# Patient Record
Sex: Male | Born: 1965 | Race: White | Hispanic: No | Marital: Married | Smoking: Never smoker
Health system: Southern US, Community
[De-identification: ages and names within clinical notes are randomized; demographics above are authoritative.]

## PROBLEM LIST (undated history)

## (undated) DIAGNOSIS — K219 Gastro-esophageal reflux disease without esophagitis: Secondary | ICD-10-CM

## (undated) DIAGNOSIS — I10 Essential (primary) hypertension: Secondary | ICD-10-CM

## (undated) HISTORY — PX: TONSILLECTOMY: SUR1361

---

## 1998-08-25 ENCOUNTER — Encounter: Admission: RE | Admit: 1998-08-25 | Discharge: 1998-08-25 | Payer: Self-pay | Admitting: Sports Medicine

## 2001-05-10 ENCOUNTER — Ambulatory Visit (HOSPITAL_COMMUNITY): Admission: RE | Admit: 2001-05-10 | Discharge: 2001-05-10 | Payer: Self-pay | Admitting: Gastroenterology

## 2006-06-30 ENCOUNTER — Encounter: Admission: RE | Admit: 2006-06-30 | Discharge: 2006-06-30 | Payer: Self-pay | Admitting: Gastroenterology

## 2008-10-09 ENCOUNTER — Emergency Department (HOSPITAL_BASED_OUTPATIENT_CLINIC_OR_DEPARTMENT_OTHER): Admission: EM | Admit: 2008-10-09 | Discharge: 2008-10-09 | Payer: Self-pay | Admitting: Emergency Medicine

## 2008-10-31 ENCOUNTER — Ambulatory Visit (HOSPITAL_BASED_OUTPATIENT_CLINIC_OR_DEPARTMENT_OTHER): Admission: RE | Admit: 2008-10-31 | Discharge: 2008-10-31 | Payer: Self-pay | Admitting: Family Medicine

## 2008-10-31 ENCOUNTER — Ambulatory Visit: Payer: Self-pay | Admitting: Diagnostic Radiology

## 2010-08-13 ENCOUNTER — Emergency Department (HOSPITAL_COMMUNITY): Admission: EM | Admit: 2010-08-13 | Discharge: 2010-08-13 | Payer: Self-pay | Admitting: Emergency Medicine

## 2010-08-18 ENCOUNTER — Ambulatory Visit (HOSPITAL_BASED_OUTPATIENT_CLINIC_OR_DEPARTMENT_OTHER): Admission: RE | Admit: 2010-08-18 | Discharge: 2010-08-18 | Payer: Self-pay | Admitting: Orthopedic Surgery

## 2011-02-04 LAB — POCT HEMOGLOBIN-HEMACUE: Hemoglobin: 15.7 g/dL (ref 13.0–17.0)

## 2011-04-09 NOTE — Procedures (Signed)
[facility]  Patient:    Timothy Contreras, Timothy Contreras                         MRN: [id number] Proc. Date: 05/10/01 Adm. Date:  [id number] Attending:  Charna Elizabeth CC:         Gabriel Earing, M.D.   Procedure Report  DATE OF BIRTH:  July 13, 1966  REFERRING PHYSICIAN:  Gabriel Earing, M.D.  PROCEDURE PERFORMED:  Esophagogastroduodenoscopy with biopsies.  ENDOSCOPIST:  Anselmo Rod, M.D.  INSTRUMENT USED:  Olympus video panendoscope.  INDICATIONS FOR PROCEDURE:  The patient is a 45 year old white male with a history of black stool and drop in hemoglobin to 9.8 gm/dl.  Rule out peptic ulcer disease, esophagitis, gastritis, etc.  PREPROCEDURE PREPARATION:  Informed consent was procured from the patient. The patient was fasted for eight hours prior to the procedure.  CBC was checked the night prior to the procedure and hemoglobin was 10.6 gm/dl.  PT and PTT were within normal limits.  PREPROCEDURE PHYSICAL:  The patient had stable vital signs.  Neck supple. Chest clear to auscultation.  S1, S2 regular.  Abdomen soft with normal abdominal bowel sounds.  DESCRIPTION OF PROCEDURE:  The patient was placed in left lateral decubitus position and sedated with 50 mg of Demerol and 5 mg of Versed intravenously. Once the patient was adequately sedated and maintained on low-flow oxygen and continuous cardiac monitoring, the Olympus video panendoscope was advanced through the mouthpiece, over the tongue, into the esophagus under direct vision.  There was grade 1 distal esophagitis seen.  The rest of the gastric mucosa appeared healthy and without lesion.  There was moderate to severe duodenitis of the duodenal bulb.  The small bowel distal to the bulb appeared normal.  There was no outlet obstruction.  Biopsies were done from the antrum to rule out presence of Helicobacter pylori by CLO test.  IMPRESSION: 1. Grade 1 distal esophagitis. 2. Severe duodenitis in duodenal  bulb. 3. Normal small bowel distal to the bulb.  RECOMMENDATION: 1. Continue PPIs for now. 2. Outpatient follow-up in the next four weeks.  A repeat CBC will be done at    that time. 3. Avoid all nonsteroidals including aspirin for now. 4. Call the office immediately for any recurrence of black stool or    bright red blood per rectum. DD:  05/10/01 TD:  05/10/01 Job: [id number]

## 2011-08-17 ENCOUNTER — Encounter (HOSPITAL_BASED_OUTPATIENT_CLINIC_OR_DEPARTMENT_OTHER)
Admission: RE | Admit: 2011-08-17 | Discharge: 2011-08-17 | Disposition: A | Discharge status: Home / Self Care | Payer: Worker's Compensation | Source: Ambulatory Visit | Attending: Orthopedic Surgery | Admitting: Orthopedic Surgery

## 2011-08-23 ENCOUNTER — Ambulatory Visit (HOSPITAL_BASED_OUTPATIENT_CLINIC_OR_DEPARTMENT_OTHER)
Admission: RE | Admit: 2011-08-23 | Discharge: 2011-08-23 | Disposition: A | Discharge status: Home / Self Care | Payer: Worker's Compensation | Source: Ambulatory Visit | Attending: Orthopedic Surgery | Admitting: Orthopedic Surgery

## 2011-08-23 DIAGNOSIS — Z01810 Encounter for preprocedural cardiovascular examination: Secondary | ICD-10-CM | POA: Insufficient documentation

## 2011-08-23 DIAGNOSIS — Z01812 Encounter for preprocedural laboratory examination: Secondary | ICD-10-CM | POA: Insufficient documentation

## 2011-08-23 DIAGNOSIS — K219 Gastro-esophageal reflux disease without esophagitis: Secondary | ICD-10-CM | POA: Insufficient documentation

## 2011-08-23 DIAGNOSIS — G5600 Carpal tunnel syndrome, unspecified upper limb: Secondary | ICD-10-CM | POA: Insufficient documentation

## 2011-08-23 LAB — POCT HEMOGLOBIN-HEMACUE: Hemoglobin: 16.1 g/dL (ref 13.0–17.0)

## 2011-09-01 NOTE — Op Note (Signed)
NAME:  Timothy Contreras, Timothy Contreras NO.:  [id number]  MEDICAL RECORD NO.:  [id number]  LOCATION:                                 FACILITY:  PHYSICIAN:  Betha Loa, MD             DATE OF BIRTH:  DATE OF PROCEDURE:  08/23/2011 DATE OF DISCHARGE:                              OPERATIVE REPORT   PREOPERATIVE DIAGNOSIS:  Left carpal tunnel syndrome.  POSTOPERATIVE DIAGNOSIS:  Left carpal tunnel syndrome.  PROCEDURE:  Left carpal tunnel release.  SURGEON:  Betha Loa, MD  ASSISTANT:  None.  ANESTHESIA:  Bier block.  IV fluids per anesthesia flow sheet.  ESTIMATED BLOOD LOSS:  Minimal.  COMPLICATIONS:  None.  SPECIMENS:  None.  TOURNIQUET TIME:  32 minutes.  DISPOSITION:  Stable to PACU.  INDICATIONS:  Mr. Halt is a 45 year old male who 1 year ago suffered a left distal radius fracture.  This was treated with open reduction and internal fixation.  This was complicated by complex regional pain syndrome postoperatively.  He has recovered from this with the help of stellate ganglion blocks.  He continued to have numbness in the thumb and index finger particularly at the thumb index webspace.  Nerve conduction studies were performed confirming carpal tunnel syndrome.  We discussed the nature of carpal tunnel syndrome.  We discussed surgical release.  Risks, benefits, and alternatives of surgery were discussed including the risk of blood loss, infection, damage to nerves, vessels, tendons, ligaments, bone, failure of surgery, need for additional surgery, complications with wound healing, continued pain, and continued carpal tunnel syndrome.  He voiced understanding of these risks and elected to proceed.  OPERATIVE COURSE:  After being identified preoperatively by myself, the patient and I agreed upon procedure and site of procedure.  The surgical site was marked.  The risks, benefits, and alternatives of surgery were reviewed and he wished to proceed.  Surgical  consent had been signed. He was given 1 g of IV Ancef as preoperative antibiotic prophylaxis.  He was transferred to the operating room and placed on the operating table in a supine position with the left upper extremity on arm board. Regional block anesthesia with Bier block was performed by the anesthesiologist.  The tourniquet at the proximal aspect of the forearm had been inflated to 250 mmHg after exsanguination of limb with an Esmarch bandage.  Left upper extremity was prepped and draped in normal sterile orthopedic fashion.  Surgical pause was performed between surgeons, Anesthesia, operating room staff, and all were in agreement as to the patient procedure and site of procedure.  Volar incision was made centered over the transverse carpal ligament.  This was carried into subcutaneous tissues by spreading technique.  Bipolar electrocautery was used throughout the case to obtain hemostasis.  The palmar fascia was incised.  The transverse carpal ligament was identified and incised distally.  It was ensured that the entirety of the transverse carpal ligament had been incised.  It was then incised in a proximal direction using the knife.  The distal aspect of the volar antebrachial fascia was then split using the scissors.  The finger was placed in the  wound to ensure adequate decompression of the median nerve, which was the case. The nerve was inspected.  There was some adhesion to the surrounding tissues.  This was freed up.  The nerve was hyperemic and somewhat flattened.  The motor branch was identified and was intact.  There were no masses noted within the carpal tunnel.  The wound was copiously irrigated with sterile saline.  The skin was closed using 4-0 nylon in a horizontal mattress fashion.  The wound was injected with 10 mL of 0.25% plain Marcaine to aid in postoperative analgesia.  The wound was then dressed with sterile Xeroform, 4x4s, and ABD and wrapped with Kerlix  and Ace bandage.  The tourniquet was deflated at 32 minutes.  The fingertips were pink with brisk capillary refill after deflation of the tourniquet. The operative drapes were broken down and the patient was awoken from anesthesia safely.  He was transferred back to the stretcher and taken to the PACU in stable condition.  I will give him Percocet 5/325 one to two p.o. q.6 h. p.r.n. pain, dispensed #40.  I will see him back in the office in 1 week for postoperative followup.     Betha Loa, MD     KK/MEDQ  D:  08/23/2011  T:  08/23/2011  Job:  [id number]  Electronically Signed by Betha Loa  on 09/01/2011 01:56:13 PM

## 2015-10-29 DIAGNOSIS — M7712 Lateral epicondylitis, left elbow: Secondary | ICD-10-CM | POA: Insufficient documentation

## 2015-12-11 DIAGNOSIS — Z0000 Encounter for general adult medical examination without abnormal findings: Secondary | ICD-10-CM | POA: Insufficient documentation

## 2015-12-11 DIAGNOSIS — K219 Gastro-esophageal reflux disease without esophagitis: Secondary | ICD-10-CM | POA: Insufficient documentation

## 2016-01-01 DIAGNOSIS — I10 Essential (primary) hypertension: Secondary | ICD-10-CM | POA: Insufficient documentation

## 2016-01-01 DIAGNOSIS — G4700 Insomnia, unspecified: Secondary | ICD-10-CM | POA: Insufficient documentation

## 2016-07-21 DIAGNOSIS — R300 Dysuria: Secondary | ICD-10-CM | POA: Insufficient documentation

## 2016-07-21 DIAGNOSIS — N410 Acute prostatitis: Secondary | ICD-10-CM | POA: Insufficient documentation

## 2016-08-11 DIAGNOSIS — L290 Pruritus ani: Secondary | ICD-10-CM | POA: Insufficient documentation

## 2016-08-11 DIAGNOSIS — K6289 Other specified diseases of anus and rectum: Secondary | ICD-10-CM | POA: Insufficient documentation

## 2017-06-24 DIAGNOSIS — R3129 Other microscopic hematuria: Secondary | ICD-10-CM | POA: Insufficient documentation

## 2018-03-10 ENCOUNTER — Emergency Department (HOSPITAL_BASED_OUTPATIENT_CLINIC_OR_DEPARTMENT_OTHER): Payer: 59

## 2018-03-10 ENCOUNTER — Emergency Department (HOSPITAL_BASED_OUTPATIENT_CLINIC_OR_DEPARTMENT_OTHER)
Admission: EM | Admit: 2018-03-10 | Discharge: 2018-03-10 | Disposition: A | Discharge status: Home / Self Care | Payer: 59 | Attending: Physician Assistant | Admitting: Physician Assistant

## 2018-03-10 ENCOUNTER — Other Ambulatory Visit: Payer: Self-pay

## 2018-03-10 ENCOUNTER — Encounter (HOSPITAL_BASED_OUTPATIENT_CLINIC_OR_DEPARTMENT_OTHER): Payer: Self-pay

## 2018-03-10 DIAGNOSIS — I10 Essential (primary) hypertension: Secondary | ICD-10-CM | POA: Diagnosis not present

## 2018-03-10 DIAGNOSIS — K219 Gastro-esophageal reflux disease without esophagitis: Secondary | ICD-10-CM | POA: Diagnosis not present

## 2018-03-10 DIAGNOSIS — R0789 Other chest pain: Secondary | ICD-10-CM | POA: Diagnosis present

## 2018-03-10 HISTORY — DX: Essential (primary) hypertension: I10

## 2018-03-10 HISTORY — DX: Gastro-esophageal reflux disease without esophagitis: K21.9

## 2018-03-10 LAB — BASIC METABOLIC PANEL
Anion gap: 9 (ref 5–15)
BUN: 14 mg/dL (ref 6–20)
CHLORIDE: 105 mmol/L (ref 101–111)
CO2: 23 mmol/L (ref 22–32)
CREATININE: 1.07 mg/dL (ref 0.61–1.24)
Calcium: 9.3 mg/dL (ref 8.9–10.3)
GFR calc Af Amer: >60 mL/min (ref >60)
GFR calc non Af Amer: >60 mL/min (ref >60)
Glucose, Bld: 94 mg/dL (ref 65–99)
POTASSIUM: 4.1 mmol/L (ref 3.5–5.1)
Sodium: 137 mmol/L (ref 135–145)

## 2018-03-10 LAB — CBC
HEMATOCRIT: 46.2 % (ref 39.0–52.0)
HEMOGLOBIN: 15.9 g/dL (ref 13.0–17.0)
MCH: 31.9 pg (ref 26.0–34.0)
MCHC: 34.4 g/dL (ref 30.0–36.0)
MCV: 92.6 fL (ref 78.0–100.0)
Platelets: 189 10*3/uL (ref 150–400)
RBC: 4.99 MIL/uL (ref 4.22–5.81)
RDW: 13.3 % (ref 11.5–15.5)
WBC: 6.7 10*3/uL (ref 4.0–10.5)

## 2018-03-10 LAB — TROPONIN I: Troponin I: <0.03 ng/mL (ref <0.03)

## 2018-03-10 MED ORDER — GI COCKTAIL ~~LOC~~
30.0000 mL | Freq: Once | ORAL | Status: AC
  Administered 2018-03-10: 30 mL via ORAL
  Filled 2018-03-10: qty 30

## 2018-03-10 NOTE — Discharge Instructions (Addendum)
Your workup today is very reassuring, EKG, chest x-ray and lab work does not show any acute heart or lung problem causing her symptoms today, I think this is likely due to reflux and esophageal spasm, please continue with your home Dexilant, you may use Tums as needed for breakthrough symptoms.  Make sure you are not eating and drinking right before laying down.  Please follow-up with your primary care doctor and if symptoms persist please follow-up with your GI doctor as well.  Return to the ED for worsening chest pain, especially if it is exertional or radiates to the arm neck or jaw, shortness of breath, feel like you are going to pass out, fevers or chills or any other new or concerning symptoms.

## 2018-03-10 NOTE — ED Triage Notes (Signed)
Pt c/o burping and rt sided chest pain, pain when taking a deep breath since 1pm

## 2018-03-10 NOTE — ED Notes (Signed)
ED Provider at bedside. 

## 2018-03-10 NOTE — ED Provider Notes (Signed)
[facility] EMERGENCY DEPARTMENT Provider Note   CSN: [id number] Arrival date & time: 03/10/18  1439     History   Chief Complaint Chief Complaint  Patient presents with  . Chest Pain    HPI Timothy Contreras is a 52 y.o. male.  Timothy Contreras is a 52 y.o. Male with a history of hypertension and GERD, who presents to the ED for evaluation of right-sided chest pain.  Patient reports symptoms started around 1 PM after eating lunch since then he has been having right-sided chest pain which he describes as a tightness.  With lots of belching, he reports he was initially having some pain with taking a deep breatbut reports while waiting in the ED his symptoms have almost completely resolved without intervention.  Chest pain is not exertional does not radiate.  Patient denies any associated shortness of breath, lightheadedness, dizziness or syncope.  Patient denies any fevers or chills, no cough or URI symptoms.  He denies abdominal pain, nausea, vomiting or diaphoresis.  Patient reports this feels like his similar episodes of GERD and indigestion, he is currently taking Dexilant daily has not missed any doses.  Patient denies any history of prior cardiac events, his risk factors include hypertension and he reports he was recently diagnosed with high cholesterol has not been started on medication yet no smoking history, no family history of early heart disease.  Patient denies any history of PE or DVT, no lower extremity edema or pain, no recent long distance travel or recent surgeries.     Past Medical History:  Diagnosis Date  . GERD (gastroesophageal reflux disease)   . Hypertension     There are no active problems to display for this patient.   History reviewed. No pertinent surgical history.      Home Medications    Prior to Admission medications   Medication Sig Start Date End Date Taking? Authorizing Provider  losartan (COZAAR) 25 MG tablet Take 25 mg by mouth daily.    Yes [provider]    Family History No family history on file.  Social History Social History   Tobacco Use  . Smoking status: Not on file  . Smokeless tobacco: Never Used  Substance Use Topics  . Alcohol use: Yes  . Drug use: Never     Allergies   Ivp dye [iodinated diagnostic agents]   Review of Systems Review of Systems  Constitutional: Negative for chills and fever.  HENT: Negative for congestion, rhinorrhea and sore throat.   Eyes: Negative for visual disturbance.  Respiratory: Negative for cough, chest tightness, shortness of breath and wheezing.   Cardiovascular: Positive for chest pain. Negative for palpitations and leg swelling.  Gastrointestinal: Negative for abdominal pain, blood in stool, nausea and vomiting.  Genitourinary: Negative for dysuria, flank pain and frequency.  Musculoskeletal: Negative for arthralgias and myalgias.  Skin: Negative for color change and rash.  Neurological: Negative for dizziness, syncope and light-headedness.     Physical Exam Updated Vital Signs BP (!) 145/95 (BP Location: Right Arm)   Pulse 60   Temp 97.6 F (36.4 C) (Oral)   Resp 16   Ht 5\' 8"  (1.727 m)   Wt 81.6 kg (180 lb)   SpO2 100%   BMI 27.37 kg/m   Physical Exam  Constitutional: He appears well-developed and well-nourished. No distress.  Well-appearing and in NAD  HENT:  Head: Normocephalic and atraumatic.  Mouth/Throat: Oropharynx is clear and moist.  Eyes: Pupils are equal,  round, and reactive to light. EOM are normal. Right eye exhibits no discharge. Left eye exhibits no discharge.  Neck: Neck supple. No JVD present. No tracheal deviation present.  Cardiovascular: Normal rate, regular rhythm, normal heart sounds and intact distal pulses. Exam reveals no gallop and no friction rub.  No murmur heard. Pulses:      Radial pulses are 2+ on the right side, and 2+ on the left side.       Dorsalis pedis pulses are 2+ on the right side, and 2+ on  the left side.  Pulmonary/Chest: Effort normal and breath sounds normal. No stridor. No respiratory distress. He has no wheezes. He has no rales.  Respirations equal and unlabored, patient able to speak in full sentences, lungs clear to auscultation bilaterally  Abdominal: Soft. Bowel sounds are normal. He exhibits no distension and no mass. There is no tenderness. There is no guarding.  Neurological: He is alert. Coordination normal.  A&O, speech clear, following all commands Moving all extremities without difficulty  Skin: Skin is warm and dry. Capillary refill takes less than 2 seconds. He is not diaphoretic.  Psychiatric: He has a normal mood and affect. His behavior is normal.  Nursing note and vitals reviewed.    ED Treatments / Results  Labs (all labs ordered are listed, but only abnormal results are displayed) Labs Reviewed  BASIC METABOLIC PANEL  CBC  TROPONIN I    EKG EKG Interpretation  Date/Time:  Friday March 10 2018 14:49:05 EDT Ventricular Rate:  56 PR Interval:  176 QRS Duration: 80 QT Interval:  402 QTC Calculation: 387 R Axis:   11 Text Interpretation:  Sinus bradycardia Otherwise normal ECG Normal sinus rhythm Confirmed by Bary Castilla ([id number]) on 03/10/2018 6:51:33 PM   Radiology Dg Chest 2 View  Result Date: 03/10/2018 CLINICAL DATA:  Inspiratory chest pain. EXAM: CHEST - 2 VIEW COMPARISON:  Chest radiograph 10/31/2008 FINDINGS: The heart size and mediastinal contours are within normal limits. Both lungs are clear. The visualized skeletal structures are unremarkable. IMPRESSION: No active cardiopulmonary disease. Electronically Signed   By: Deatra Robinson M.D.   On: 03/10/2018 15:26    Procedures Procedures (including critical care time)  Medications Ordered in ED Medications  gi cocktail (Maalox,Lidocaine,Donnatal) (30 mLs Oral Given 03/10/18 1807)     Initial Impression / Assessment and Plan / ED Course  I have reviewed the triage vital  signs and the nursing notes.  Pertinent labs & imaging results that were available during my care of the patient were reviewed by me and considered in my medical decision making (see chart for details).  Patient is to be discharged with recommendation to follow up with PCP in regards to today's hospital visit. Chest pain is not likely of cardiac or pulmonary etiology d/t presentation, PERC negative, VSS, no tracheal deviation, no JVD or new murmur, RRR, breath sounds equal bilaterally, EKG without acute abnormalities, negative troponin, and negative CXR. I think pain is likely an exacerbation of his GERD, especially since pain is associated with increased belching and indigestion and was made worse when lying down after eating.  Symptoms completely resolved after GI cocktail.  Offered patient second troponin, but he feels much better and declines at this time I feel this is very reasonable given his symptoms are extremely atypical and I do not think that ACS is very likely at this time.   Patient is already established with primary care as well as GI.  We will have him continue  his Dexilant. Pt has been advised to return to the ED if CP becomes exertional, associated with diaphoresis or nausea, radiates to left jaw/arm, worsens or becomes concerning in any way. Pt appears reliable for follow up and is agreeable to discharge.     Final Clinical Impressions(s) / ED Diagnoses   Final diagnoses:  Atypical chest pain  Gastroesophageal reflux disease, esophagitis presence not specified    ED Discharge Orders    None       Dartha LodgeFord, Gaytha Raybourn N, [place]PA-C 03/11/18 1243    Abelino DerrickMackuen, Courteney Lyn, MD 03/11/18 2349

## 2018-03-10 NOTE — ED Notes (Signed)
Pain to mid chest during arrival but denies pain at this assessment.

## 2018-03-22 DIAGNOSIS — E7800 Pure hypercholesterolemia, unspecified: Secondary | ICD-10-CM | POA: Insufficient documentation

## 2018-10-16 DIAGNOSIS — M7522 Bicipital tendinitis, left shoulder: Secondary | ICD-10-CM | POA: Insufficient documentation

## 2019-03-16 ENCOUNTER — Other Ambulatory Visit: Payer: Self-pay

## 2019-03-16 ENCOUNTER — Emergency Department (HOSPITAL_BASED_OUTPATIENT_CLINIC_OR_DEPARTMENT_OTHER)
Admission: EM | Admit: 2019-03-16 | Discharge: 2019-03-16 | Disposition: A | Discharge status: Home / Self Care | Payer: 59 | Attending: Emergency Medicine | Admitting: Emergency Medicine

## 2019-03-16 ENCOUNTER — Emergency Department (HOSPITAL_BASED_OUTPATIENT_CLINIC_OR_DEPARTMENT_OTHER): Payer: 59

## 2019-03-16 ENCOUNTER — Encounter (HOSPITAL_BASED_OUTPATIENT_CLINIC_OR_DEPARTMENT_OTHER): Payer: Self-pay | Admitting: Emergency Medicine

## 2019-03-16 DIAGNOSIS — X58XXXA Exposure to other specified factors, initial encounter: Secondary | ICD-10-CM | POA: Insufficient documentation

## 2019-03-16 DIAGNOSIS — Y92009 Unspecified place in unspecified non-institutional (private) residence as the place of occurrence of the external cause: Secondary | ICD-10-CM | POA: Insufficient documentation

## 2019-03-16 DIAGNOSIS — Y999 Unspecified external cause status: Secondary | ICD-10-CM | POA: Diagnosis not present

## 2019-03-16 DIAGNOSIS — S01311A Laceration without foreign body of right ear, initial encounter: Secondary | ICD-10-CM | POA: Diagnosis not present

## 2019-03-16 DIAGNOSIS — R55 Syncope and collapse: Secondary | ICD-10-CM | POA: Diagnosis present

## 2019-03-16 DIAGNOSIS — I10 Essential (primary) hypertension: Secondary | ICD-10-CM | POA: Diagnosis not present

## 2019-03-16 DIAGNOSIS — Y939 Activity, unspecified: Secondary | ICD-10-CM | POA: Insufficient documentation

## 2019-03-16 DIAGNOSIS — S0990XA Unspecified injury of head, initial encounter: Secondary | ICD-10-CM | POA: Diagnosis not present

## 2019-03-16 LAB — BASIC METABOLIC PANEL
Anion gap: 8 (ref 5–15)
BUN: 13 mg/dL (ref 6–20)
CO2: 21 mmol/L — ABNORMAL LOW (ref 22–32)
Calcium: 9 mg/dL (ref 8.9–10.3)
Chloride: 108 mmol/L (ref 98–111)
Creatinine, Ser: 0.94 mg/dL (ref 0.61–1.24)
GFR calc Af Amer: >60 mL/min (ref >60)
GFR calc non Af Amer: >60 mL/min (ref >60)
Glucose, Bld: 129 mg/dL — ABNORMAL HIGH (ref 70–99)
Potassium: 3.5 mmol/L (ref 3.5–5.1)
Sodium: 137 mmol/L (ref 135–145)

## 2019-03-16 LAB — CBC WITH DIFFERENTIAL/PLATELET
Abs Immature Granulocytes: 0.03 10*3/uL (ref 0.00–0.07)
Basophils Absolute: 0 10*3/uL (ref 0.0–0.1)
Basophils Relative: 0 %
Eosinophils Absolute: 0 10*3/uL (ref 0.0–0.5)
Eosinophils Relative: 0 %
HCT: 43.4 % (ref 39.0–52.0)
Hemoglobin: 13.6 g/dL (ref 13.0–17.0)
Immature Granulocytes: 0 %
Lymphocytes Relative: 8 %
Lymphs Abs: 0.9 10*3/uL (ref 0.7–4.0)
MCH: 31 pg (ref 26.0–34.0)
MCHC: 31.3 g/dL (ref 30.0–36.0)
MCV: 98.9 fL (ref 80.0–100.0)
Monocytes Absolute: 1 10*3/uL (ref 0.1–1.0)
Monocytes Relative: 9 %
Neutro Abs: 9.2 10*3/uL — ABNORMAL HIGH (ref 1.7–7.7)
Neutrophils Relative %: 83 %
Platelets: 162 10*3/uL (ref 150–400)
RBC: 4.39 MIL/uL (ref 4.22–5.81)
RDW: 12.7 % (ref 11.5–15.5)
WBC: 11.2 10*3/uL — ABNORMAL HIGH (ref 4.0–10.5)
nRBC: 0 % (ref 0.0–0.2)

## 2019-03-16 MED ORDER — LIDOCAINE HCL (PF) 1 % IJ SOLN
5.0000 mL | Freq: Once | INTRAMUSCULAR | Status: AC
  Administered 2019-03-16: 5 mL via INTRADERMAL
  Filled 2019-03-16: qty 5

## 2019-03-16 NOTE — ED Provider Notes (Signed)
[facility] EMERGENCY DEPARTMENT Provider Note   CSN: [id number] Arrival date & time: 03/16/19  0825    History   Chief Complaint Chief Complaint  Patient presents with  . Loss of Consciousness    HPI Timothy Contreras is a 53 y.o. male.     Patient is a 53 year old male with past medical history of hypertension and GERD.  He presents today with complaints of head injury/syncope.  Patient states that he found himself on the floor this morning outside the bathroom door.  He apparently attempted to walk to the bathroom and the door was closed.  He is not certain as to what happened, but believes he may have struck his head on the close door and knocked himself out.  Patient does have a headache and a contusion to the right side of his head.  He also has a small laceration to the right earlobe.  The history is provided by the patient.  Loss of Consciousness  Episode history:  Single Most recent episode:  Today Progression:  Resolved Witnessed: no   Relieved by:  Nothing Worsened by:  Nothing   Past Medical History:  Diagnosis Date  . GERD (gastroesophageal reflux disease)   . Hypertension     There are no active problems to display for this patient.   Past Surgical History:  Procedure Laterality Date  . TONSILLECTOMY          Home Medications    Prior to Admission medications   Medication Sig Start Date End Date Taking? Authorizing Provider  Dexlansoprazole (DEXILANT PO) Take by mouth.   Yes [provider]  losartan (COZAAR) 25 MG tablet Take 25 mg by mouth daily.    [provider]    Family History No family history on file.  Social History Social History   Tobacco Use  . Smoking status: Never Smoker  . Smokeless tobacco: Never Used  Substance Use Topics  . Alcohol use: Yes  . Drug use: Never     Allergies   Ivp dye [iodinated diagnostic agents]   Review of Systems Review of Systems  Cardiovascular: Positive for  syncope.  All other systems reviewed and are negative.    Physical Exam Updated Vital Signs BP (!) 148/82   Pulse 79   Temp 98.7 F (37.1 C) (Oral)   Resp 16   Ht 5\' 7"  (1.702 m)   Wt 81.6 kg   SpO2 98%   BMI 28.19 kg/m   Physical Exam Vitals signs and nursing note reviewed.  Constitutional:      General: He is not in acute distress.    Appearance: He is well-developed. He is not diaphoretic.  HENT:     Head: Normocephalic and atraumatic.     Right Ear: Tympanic membrane normal.     Left Ear: Tympanic membrane normal.     Ears:     Comments: There is a 1 cm laceration to the right earlobe. Eyes:     Extraocular Movements: Extraocular movements intact.     Pupils: Pupils are equal, round, and reactive to light.  Neck:     Musculoskeletal: Normal range of motion and neck supple.  Cardiovascular:     Rate and Rhythm: Normal rate and regular rhythm.     Heart sounds: No murmur. No friction rub.  Pulmonary:     Effort: Pulmonary effort is normal. No respiratory distress.     Breath sounds: Normal breath sounds. No wheezing or rales.  Abdominal:  General: Bowel sounds are normal. There is no distension.     Palpations: Abdomen is soft.     Tenderness: There is no abdominal tenderness.  Musculoskeletal: Normal range of motion.  Skin:    General: Skin is warm and dry.  Neurological:     General: No focal deficit present.     Mental Status: He is alert and oriented to person, place, and time.     Cranial Nerves: No cranial nerve deficit.     Motor: No weakness.     Coordination: Coordination normal.      ED Treatments / Results  Labs (all labs ordered are listed, but only abnormal results are displayed) Labs Reviewed  BASIC METABOLIC PANEL  CBC WITH DIFFERENTIAL/PLATELET    EKG EKG Interpretation  Date/Time:  Friday March 16 2019 08:34:29 EDT Ventricular Rate:  85 PR Interval:    QRS Duration: 84 QT Interval:  373 QTC Calculation: 444 R Axis:   13  Text Interpretation:  Sinus rhythm Consider right atrial enlargement Confirmed by Geoffery LyonseLo, Shenae Bonanno ([id number]) on 03/16/2019 8:44:49 AM   Radiology No results found.  Procedures Procedures (including critical care time)  Medications Ordered in ED Medications  lidocaine (PF) (XYLOCAINE) 1 % injection 5 mL (has no administration in time range)     Initial Impression / Assessment and Plan / ED Course  I have reviewed the triage vital signs and the nursing notes.  Pertinent labs & imaging results that were available during my care of the patient were reviewed by me and considered in my medical decision making (see chart for details).  Patient presenting here with a laceration to the right earlobe after an apparent syncopal episode that occurred at home.  Patient cannot recall specifically what happened, but woke up outside of the bathroom door.  It sounds as though he had some sort of vasovagal episode.  His work-up shows no significant abnormality including laboratory studies, EKG, and head CT.  The laceration was repaired and patient will be discharged to home.  He is to return as needed for any problems.  LACERATION REPAIR Performed by: Geoffery Lyonsouglas Tyren Dugar Authorized by: Geoffery Lyonsouglas Alaylah Heatherington Consent: Verbal consent obtained. Risks and benefits: risks, benefits and alternatives were discussed Consent given by: patient Patient identity confirmed: provided demographic data Prepped and Draped in normal sterile fashion Wound explored  Laceration Location: Right earlobe  Laceration Length: 1 cm  No Foreign Bodies seen or palpated  Anesthesia: local infiltration  Local anesthetic: lidocaine 1 % without epinephrine  Anesthetic total: 1 ml  Irrigation method: syringe Amount of cleaning: standard  Skin closure: 5-0 Ethilon  Number of sutures: 2  Technique: Simple interrupted  Patient tolerance: Patient tolerated the procedure well with no immediate complications.   Final Clinical  Impressions(s) / ED Diagnoses   Final diagnoses:  None    ED Discharge Orders    None       Geoffery Lyonselo, Areya Lemmerman, MD 03/16/19 1004

## 2019-03-16 NOTE — ED Notes (Signed)
Patient transported to CT 

## 2019-03-16 NOTE — ED Triage Notes (Signed)
Pt states he woke up on the floor outside the bathroom door this morning. He does not remember what happened. Small lac to R earlobe noted and small bruise to R chest. Also c/o back pain. Denies chest pain, SOB, dizziness.

## 2019-03-16 NOTE — Discharge Instructions (Addendum)
Local wound care with bacitracin twice daily.  Sutures are to be removed in 6 to 7 days.  Please follow-up with your primary doctor for this.  Return to the emergency department in the meantime if you develop any new and/or concerning symptoms.

## 2019-05-31 ENCOUNTER — Ambulatory Visit: Payer: 59 | Admitting: Cardiology

## 2019-06-01 ENCOUNTER — Ambulatory Visit: Payer: 59 | Admitting: Cardiology

## 2019-06-04 ENCOUNTER — Other Ambulatory Visit: Payer: Self-pay

## 2019-06-04 ENCOUNTER — Encounter: Payer: Self-pay | Admitting: Cardiology

## 2019-06-04 ENCOUNTER — Ambulatory Visit (INDEPENDENT_AMBULATORY_CARE_PROVIDER_SITE_OTHER): Payer: 59 | Admitting: Cardiology

## 2019-06-04 VITALS — BP 118/72 | HR 63 | Ht 67.0 in | Wt 191.0 lb

## 2019-06-04 DIAGNOSIS — I10 Essential (primary) hypertension: Secondary | ICD-10-CM

## 2019-06-04 DIAGNOSIS — R002 Palpitations: Secondary | ICD-10-CM

## 2019-06-04 DIAGNOSIS — I209 Angina pectoris, unspecified: Secondary | ICD-10-CM

## 2019-06-04 DIAGNOSIS — E782 Mixed hyperlipidemia: Secondary | ICD-10-CM | POA: Insufficient documentation

## 2019-06-04 DIAGNOSIS — R0609 Other forms of dyspnea: Secondary | ICD-10-CM

## 2019-06-04 MED ORDER — PREDNISONE 50 MG PO TABS: ORAL_TABLET | ORAL | Status: DC

## 2019-06-04 MED ORDER — METOPROLOL TARTRATE 50 MG PO TABS: 50.0000 mg | ORAL_TABLET | Freq: Two times a day (BID) | ORAL | 0 refills | Status: DC

## 2019-06-04 NOTE — Patient Instructions (Addendum)
Medication Instructions:  Your physician recommends that you continue on your current medications as directed. Please refer to the Current Medication list given to you today.  If you need a refill on your cardiac medications before your next appointment, please call your pharmacy.   Lab work: Your physician recommends that you return for lab work in:  3-7 days prior to CT: BMP    Testing/Procedures: Your physician has recommended that you wear a ZIO monitor. ZIO  monitors are medical devices that record the heart's electrical activity. Doctors most often use these monitors to diagnose arrhythmias. Arrhythmias are problems with the speed or rhythm of the heartbeat. The monitor is a small, portable device. You can wear one while you do your normal daily activities. This is usually used to diagnose what is causing palpitations/syncope (passing out).     Your physician has requested that you have cardiac CT. Cardiac computed tomography (CT) is a painless test that uses an x-ray machine to take clear, detailed pictures of your heart. For further information please visit [email or web address]. Please follow instruction sheet as given.  Please [facility] the [facility] main entrance of [facility] at xx:xx AM (30-45 minutes prior to test start time)  [facility] [address] [place], [place] [address] [phone]  Proceed to the [facility] Radiology Department (First Floor).  Please follow these instructions carefully (unless otherwise directed):  Hold all erectile dysfunction medications at least 48 hours prior to test.  On the Night Before the Test: . Be sure to Drink plenty of water. . Do not consume any caffeinated/decaffeinated beverages or chocolate 12 hours prior to your test. . Do not take any antihistamines 12 hours prior to your test. . If you take Metformin do not take 24 hours prior to test. . If the patient has contrast allergy: ? Patient will need  a prescription for Prednisone and very clear instructions (as follows): 1. Prednisone 50 mg - take 13 hours prior to test 2. Take another Prednisone 50 mg 7 hours prior to test 3. Take another Prednisone 50 mg 1 hour prior to test 4. Take Benadryl 50 mg 1 hour prior to test . Patient must complete all four doses of above prophylactic medications. . Patient will need a ride after test due to Benadryl.  On the Day of the Test: . Drink plenty of water. Do not drink any water within one hour of the test. . Do not eat any food 4 hours prior to the test. . You may take your regular medications prior to the test.  . Take metoprolol (Lopressor) two hours prior to test.   After the Test: . Drink plenty of water. . After receiving IV contrast, you may experience a mild flushed feeling. This is normal. . On occasion, you may experience a mild rash up to 24 hours after the test. This is not dangerous. If this occurs, you can take Benadryl 25 mg and increase your fluid intake. . If you experience trouble breathing, this can be serious. If it is severe call 911 IMMEDIATELY. If it is mild, please call our office. . If you take any of these medications: Glipizide/Metformin, Avandament, Glucavance, please do not take 48 hours after completing test.  If you have labs (blood work) drawn today and your tests are completely normal, you will receive your results only by: Marland Kitchen. MyChart Message (if you have MyChart) OR . A paper copy in the mail If you have any lab test  that is abnormal or we need to change your treatment, we will call you to review the results.  Follow-Up: At [facility], you and your health needs are our priority.  As part of our continuing mission to provide you with exceptional heart care, we have created designated Provider Care Teams.  These Care Teams include your primary Cardiologist (physician) and Advanced Practice Providers (APPs -  Physician Assistants and Nurse Practitioners) who all  work together to provide you with the care you need, when you need it. You will need a follow up appointment in 6 weeks.  Any Other Special Instructions Will Be Listed Below (If Applicable).

## 2019-06-04 NOTE — Progress Notes (Signed)
Cardiology Office Note:    Date:  06/04/2019   ID:  Timothy Contreras, DOB 08/23/1966, MRN [id number]  PCP:  Angelica ChessmanAguiar, Rafaela M, MD  Cardiologist:  Garwin Brothersajan R Revankar, MD   Referring MD: Angelica ChessmanAguiar, Rafaela M, MD    ASSESSMENT:    1. Angina pectoris (HCC)   2. Benign essential hypertension   3. DOE (dyspnea on exertion)   4. Mixed dyslipidemia    PLAN:    In order of problems listed above:  1. Chest tightness and shortness of breath on exertion: Patient has multiple risk factors for coronary artery disease.  His symptoms are concerning.  He lives a very active lifestyle and is a young gentleman.In view of the patient's symptoms, I discussed with the patient options for evaluation. Invasive and noninvasive options were given to the patient. I discussed stress testing, CT coronary angiography and coronary angiography and left heart catheterization at length. Benefits, pros and cons of each approach were discussed at length. Patient had multiple questions which were answered to the patient's satisfaction. Patient opted for non-invasive evaluation and we will set up for CT coronary angiography. Further recommendations will be made based on the findings with coronary angiography. In the interim if the patient has any significant symptoms in hospital to the nearest emergency room. 2. Essential hypertension: Blood pressure stable I told him to keep a track of his blood pressures as his syncope may be a consequence of low blood pressure.  Also in view of syncope he is advised not to drive.  He will undergo 2-week monitoring to understand his symptoms. 3. Mixed dyslipidemia: Diet was discussed.  Lipids are followed by his primary care physician.  He will get us a copy of his lab work from his primary care physician. 4. He will be seen in follow-up appointment in 6 weeks of earlier if he has any concerns.  He knows to go to the nearest emergency room for any concerning symptoms.  Patient had multiple questions  which were answered to his satisfaction.   Medication Adjustments/Labs and Tests Ordered: Current medicines are reviewed at length with the patient today.  Concerns regarding medicines are outlined above.  No orders of the defined types were placed in this encounter.  No orders of the defined types were placed in this encounter.    History of Present Illness:    Timothy Contreras is a 53 y.o. male who is being seen today for the evaluation of chest tightness at the request of Angelica ChessmanAguiar, Rafaela M, MD.  Patient is a pleasant 53 year old male with past medical history of essential hypertension and mixed dyslipidemia.  He gives a history suggesting of syncope.  This happened several weeks ago.  He tells me that his wife found him on the floor.  No chest pain orthopnea or PND.  Subsequently he has been very active.  The other day during moving his yard he was feeling chest tightness and shortness of breath and had to rest.  Sometimes he has issues radiating to the right arm.  He is not very specific about this history.  Ever since this is happened just quit exercising because he is a little nervous about it.  At the time of my evaluation, the patient is alert awake oriented and in no distress.  Past Medical History:  Diagnosis Date  . GERD (gastroesophageal reflux disease)   . Hypertension     Past Surgical History:  Procedure Laterality Date  . TONSILLECTOMY  Current Medications: Current Meds  Medication Sig  . Dexlansoprazole (DEXILANT PO) Take by mouth.  . losartan (COZAAR) 50 MG tablet Take 50 mg by mouth daily.      Allergies:   Other, Ivp dye [iodinated diagnostic agents], and Iodine-131   Social History   Socioeconomic History  . Marital status: Married    Spouse name: Not on file  . Number of children: Not on file  . Years of education: Not on file  . Highest education level: Not on file  Occupational History  . Not on file  Social Needs  . Financial resource strain:  Not on file  . Food insecurity    Worry: Not on file    Inability: Not on file  . Transportation needs    Medical: Not on file    Non-medical: Not on file  Tobacco Use  . Smoking status: Never Smoker  . Smokeless tobacco: Never Used  Substance and Sexual Activity  . Alcohol use: Yes  . Drug use: Never  . Sexual activity: Not on file  Lifestyle  . Physical activity    Days per week: Not on file    Minutes per session: Not on file  . Stress: Not on file  Relationships  . Social Herbalist on phone: Not on file    Gets together: Not on file    Attends religious service: Not on file    Active member of club or organization: Not on file    Attends meetings of clubs or organizations: Not on file    Relationship status: Not on file  Other Topics Concern  . Not on file  Social History Narrative  . Not on file     Family History: The patient's family history is not on file.  ROS:   Please see the history of present illness.    All other systems reviewed and are negative.  EKGs/Labs/Other Studies Reviewed:    The following studies were reviewed today: EKG reveals sinus rhythm and nonspecific ST-T changes   Recent Labs: 03/16/2019: BUN 13; Creatinine, Ser 0.94; Hemoglobin 13.6; Platelets 162; Potassium 3.5; Sodium 137  Recent Lipid Panel No results found for: CHOL, TRIG, HDL, CHOLHDL, VLDL, LDLCALC, LDLDIRECT  Physical Exam:    VS:  BP 118/72 (BP Location: Left Arm, Patient Position: Sitting, Cuff Size: Normal)   Pulse 63   Ht 5\' 7"  (1.702 m)   Wt 191 lb (86.6 kg)   SpO2 98%   BMI 29.91 kg/m     Wt Readings from Last 3 Encounters:  06/04/19 191 lb (86.6 kg)  03/16/19 180 lb (81.6 kg)  03/10/18 180 lb (81.6 kg)     GEN: Patient is in no acute distress HEENT: Normal NECK: No JVD; No carotid bruits LYMPHATICS: No lymphadenopathy CARDIAC: S1 S2 regular, 2/6 systolic murmur at the apex. RESPIRATORY:  Clear to auscultation without rales, wheezing or  rhonchi  ABDOMEN: Soft, non-tender, non-distended MUSCULOSKELETAL:  No edema; No deformity  SKIN: Warm and dry NEUROLOGIC:  Alert and oriented x 3 PSYCHIATRIC:  Normal affect    Signed, Jenean Lindau, MD  06/04/2019 8:47 AM    [facility]

## 2019-06-04 NOTE — Addendum Note (Signed)
Addended by: Particia Nearing B on: 06/04/2019 09:21 AM   Modules accepted: Orders

## 2019-06-26 ENCOUNTER — Telehealth (HOSPITAL_COMMUNITY): Payer: Self-pay | Admitting: Emergency Medicine

## 2019-06-26 NOTE — Telephone Encounter (Signed)
Reaching out to patient to offer assistance regarding upcoming cardiac imaging study; pt verbalizes understanding of appt date/time, parking situation and where to check in, pre-test NPO status and medications ordered, and verified current allergies; name and call back number provided for further questions should they arise Mason Dibiasio RN Navigator Cardiac Imaging [facility] [phone] office [phone] cell 

## 2019-06-27 ENCOUNTER — Other Ambulatory Visit (INDEPENDENT_AMBULATORY_CARE_PROVIDER_SITE_OTHER): Payer: 59

## 2019-06-27 ENCOUNTER — Other Ambulatory Visit: Payer: Self-pay

## 2019-06-27 ENCOUNTER — Ambulatory Visit (HOSPITAL_COMMUNITY): Payer: 59

## 2019-06-27 ENCOUNTER — Ambulatory Visit (HOSPITAL_COMMUNITY)
Admission: RE | Admit: 2019-06-27 | Discharge: 2019-06-27 | Disposition: A | Discharge status: Home / Self Care | Payer: 59 | Source: Ambulatory Visit | Attending: Cardiology | Admitting: Cardiology

## 2019-06-27 ENCOUNTER — Encounter: Payer: 59 | Admitting: *Deleted

## 2019-06-27 DIAGNOSIS — R002 Palpitations: Secondary | ICD-10-CM | POA: Diagnosis not present

## 2019-06-27 DIAGNOSIS — R0609 Other forms of dyspnea: Secondary | ICD-10-CM

## 2019-06-27 DIAGNOSIS — Z006 Encounter for examination for normal comparison and control in clinical research program: Secondary | ICD-10-CM

## 2019-06-27 DIAGNOSIS — I209 Angina pectoris, unspecified: Secondary | ICD-10-CM

## 2019-06-27 DIAGNOSIS — I10 Essential (primary) hypertension: Secondary | ICD-10-CM | POA: Insufficient documentation

## 2019-06-27 DIAGNOSIS — E782 Mixed hyperlipidemia: Secondary | ICD-10-CM | POA: Diagnosis not present

## 2019-06-27 MED ORDER — IOHEXOL 350 MG/ML SOLN
100.0000 mL | Freq: Once | INTRAVENOUS | Status: AC | PRN
  Administered 2019-06-27: 100 mL via INTRAVENOUS

## 2019-06-27 MED ORDER — NITROGLYCERIN 0.4 MG SL SUBL
SUBLINGUAL_TABLET | SUBLINGUAL | Status: AC
  Filled 2019-06-27: qty 2

## 2019-06-27 MED ORDER — NITROGLYCERIN 0.4 MG SL SUBL
0.8000 mg | SUBLINGUAL_TABLET | Freq: Once | SUBLINGUAL | Status: AC
  Administered 2019-06-27: 0.8 mg via SUBLINGUAL
  Filled 2019-06-27: qty 25

## 2019-06-27 NOTE — Research (Signed)
  CADFEM Informed Consent   Subject Name: Timothy Contreras  Subject met inclusion and exclusion criteria.  The informed consent form, study requirements and expectations were reviewed with the subject and questions and concerns were addressed prior to the signing of the consent form.  The subject verbalized understanding of the trial requirements.  The subject agreed to participate in the CADFEM trial and signed the informed consent at 0730 on 27-Jun-2019.  The informed consent was obtained prior to performance of any protocol-specific procedures for the subject.  A copy of the signed informed consent was given to the subject and a copy was placed in the subject's medical record.   Hedrick,[name]

## 2019-06-27 NOTE — Progress Notes (Signed)
CT scan completed. Tolerated well. D/C walking with wife, awake and alert. In no distress.

## 2019-07-13 ENCOUNTER — Encounter: Payer: Self-pay | Admitting: *Deleted

## 2019-07-23 ENCOUNTER — Ambulatory Visit: Payer: 59 | Admitting: Cardiology

## 2019-08-15 ENCOUNTER — Ambulatory Visit (INDEPENDENT_AMBULATORY_CARE_PROVIDER_SITE_OTHER): Payer: 59 | Admitting: Cardiology

## 2019-08-15 ENCOUNTER — Encounter: Payer: Self-pay | Admitting: Cardiology

## 2019-08-15 ENCOUNTER — Other Ambulatory Visit: Payer: Self-pay

## 2019-08-15 VITALS — BP 132/92 | HR 65 | Temp 97.2°F | Ht 67.0 in | Wt 192.0 lb

## 2019-08-15 DIAGNOSIS — E7800 Pure hypercholesterolemia, unspecified: Secondary | ICD-10-CM

## 2019-08-15 DIAGNOSIS — E782 Mixed hyperlipidemia: Secondary | ICD-10-CM | POA: Diagnosis not present

## 2019-08-15 DIAGNOSIS — I10 Essential (primary) hypertension: Secondary | ICD-10-CM | POA: Diagnosis not present

## 2019-08-15 DIAGNOSIS — Z1329 Encounter for screening for other suspected endocrine disorder: Secondary | ICD-10-CM

## 2019-08-15 NOTE — Patient Instructions (Signed)
Medication Instructions:  Your physician recommends that you continue on your current medications as directed. Please refer to the Current Medication list given to you today.  If you need a refill on your cardiac medications before your next appointment, please call your pharmacy.   Lab work: Your physician recommends that you have a BMET, CBC, TSH, hepatic and lipid to be drawn  If you have labs (blood work) drawn today and your tests are completely normal, you will receive your results only by: Marland Kitchen MyChart Message (if you have MyChart) OR . A paper copy in the mail If you have any lab test that is abnormal or we need to change your treatment, we will call you to review the results.  Testing/Procedures: NONE  Follow-Up: At [facility], you and your health needs are our priority.  As part of our continuing mission to provide you with exceptional heart care, we have created designated Provider Care Teams.  These Care Teams include your primary Cardiologist (physician) and Advanced Practice Providers (APPs -  Physician Assistants and Nurse Practitioners) who all work together to provide you with the care you need, when you need it. You will need a follow up appointment in 6 months.

## 2019-08-15 NOTE — Progress Notes (Signed)
Cardiology Office Note:    Date:  08/15/2019   ID:  Timothy Contreras, DOB December 04, 1965, MRN [id number]  PCP:  Angelica Chessman, MD  Cardiologist:  Garwin Brothers, MD   Referring MD: Angelica Chessman, MD    ASSESSMENT:    No diagnosis found. PLAN:    In order of problems listed above:  1. Essential hypertension: Primary prevention stressed with the patient.  Importance of compliance with diet and medication stressed and he vocalized understanding.  His blood pressure stable.  Diet were discussed salt issues were also discussed.  I reviewed his blood pressures that he brought from home and they are fine.  He has had an echocardiogram done at the [place] and he is trying to get me a copy of it.  He will have fasting blood work today including lipids 2. Patient will be seen in follow-up appointment in 6 months or earlier if the patient has any concerns    Medication Adjustments/Labs and Tests Ordered: Current medicines are reviewed at length with the patient today.  Concerns regarding medicines are outlined above.  No orders of the defined types were placed in this encounter.  No orders of the defined types were placed in this encounter.    Chief Complaint  Patient presents with  . Follow-up     History of Present Illness:    Timothy Contreras is a 53 y.o. male.  Patient has history of essential hypertension.  He is Emergency planning/management officer.  He underwent CT coronary angiography which was completely normal with a calcium score of 0.  Is very happy to know about it.  He denies any chest pain orthopnea or PND.  He is tolerating his blood pressure medicine well and keeps a track of his blood pressure and this is fine.  She has a good exercise tolerance.  At the time of my evaluation, the patient is alert awake oriented and in no distress.  Past Medical History:  Diagnosis Date  . GERD (gastroesophageal reflux disease)   . Hypertension     Past Surgical History:  Procedure Laterality Date  .  TONSILLECTOMY      Current Medications: Current Meds  Medication Sig  . Dexlansoprazole (DEXILANT PO) Take by mouth.  . losartan (COZAAR) 50 MG tablet Take 50 mg by mouth daily.      Allergies:   Other, Ivp dye [iodinated diagnostic agents], and Iodine-131   Social History   Socioeconomic History  . Marital status: Married    Spouse name: Not on file  . Number of children: Not on file  . Years of education: Not on file  . Highest education level: Not on file  Occupational History  . Not on file  Social Needs  . Financial resource strain: Not on file  . Food insecurity    Worry: Not on file    Inability: Not on file  . Transportation needs    Medical: Not on file    Non-medical: Not on file  Tobacco Use  . Smoking status: Never Smoker  . Smokeless tobacco: Never Used  Substance and Sexual Activity  . Alcohol use: Yes  . Drug use: Never  . Sexual activity: Not on file  Lifestyle  . Physical activity    Days per week: Not on file    Minutes per session: Not on file  . Stress: Not on file  Relationships  . Social connections    Talks on phone: Not on file  Gets together: Not on file    Attends religious service: Not on file    Active member of club or organization: Not on file    Attends meetings of clubs or organizations: Not on file    Relationship status: Not on file  Other Topics Concern  . Not on file  Social History Narrative  . Not on file     Family History: The patient's family history is not on file.  ROS:   Please see the history of present illness.    All other systems reviewed and are negative.  EKGs/Labs/Other Studies Reviewed:    The following studies were reviewed today: IMPRESSION: 1. Coronary calcium score of 0. This was 0 percentile for age and sex matched control.  2. Normal coronary origin with right dominance.  3. No evidence of CAD.  Traci Turner    EVENT MONITOR REPORT:   Patient was monitored from 06/27/19  to 07/03/19. Indication:                    palpitations Ordering physician:  Jenean Lindau, MD  Referring physician:        Jenean Lindau, MD    Baseline rhythm: Sinus  Minimum heart rate: 47BPM.  Average heart rate: 82BPM.  Maximal heart rate 150BPM.  Atrial arrhythmia: Occasional PACs  Ventricular arrhythmia: Rare PVCs  Conduction abnormality: None significant  Symptoms: None significant   Conclusion:  Unremarkable event monitor.  Interpreting  cardiologist: Jenean Lindau, MD  Date: 07/12/2019 4:44 PM   Recent Labs: 03/16/2019: BUN 13; Creatinine, Ser 0.94; Hemoglobin 13.6; Platelets 162; Potassium 3.5; Sodium 137  Recent Lipid Panel No results found for: CHOL, TRIG, HDL, CHOLHDL, VLDL, LDLCALC, LDLDIRECT  Physical Exam:    VS:  BP (!) 132/92 (BP Location: Right Arm, Patient Position: Sitting, Cuff Size: Normal)   Pulse 65   Temp (!) 97.2 F (36.2 C)   Ht 5\' 7"  (1.702 m)   Wt 192 lb (87.1 kg)   SpO2 97%   BMI 30.07 kg/m     Wt Readings from Last 3 Encounters:  08/15/19 192 lb (87.1 kg)  06/04/19 191 lb (86.6 kg)  03/16/19 180 lb (81.6 kg)     GEN: Patient is in no acute distress HEENT: Normal NECK: No JVD; No carotid bruits LYMPHATICS: No lymphadenopathy CARDIAC: Hear sounds regular, 2/6 systolic murmur at the apex. RESPIRATORY:  Clear to auscultation without rales, wheezing or rhonchi  ABDOMEN: Soft, non-tender, non-distended MUSCULOSKELETAL:  No edema; No deformity  SKIN: Warm and dry NEUROLOGIC:  Alert and oriented x 3 PSYCHIATRIC:  Normal affect   Signed, Jenean Lindau, MD  08/15/2019 8:44 AM    [facility]

## 2019-08-16 LAB — CBC
Hematocrit: 48.2 % (ref 37.5–51.0)
Hemoglobin: 16.4 g/dL (ref 13.0–17.7)
MCH: 31.6 pg (ref 26.6–33.0)
MCHC: 34 g/dL (ref 31.5–35.7)
MCV: 93 fL (ref 79–97)
Platelets: 203 10*3/uL (ref 150–450)
RBC: 5.19 x10E6/uL (ref 4.14–5.80)
RDW: 12.3 % (ref 11.6–15.4)
WBC: 5.3 10*3/uL (ref 3.4–10.8)

## 2019-08-16 LAB — BASIC METABOLIC PANEL
BUN/Creatinine Ratio: 13 (ref 9–20)
BUN: 14 mg/dL (ref 6–24)
CO2: 23 mmol/L (ref 20–29)
Calcium: 9.9 mg/dL (ref 8.7–10.2)
Chloride: 104 mmol/L (ref 96–106)
Creatinine, Ser: 1.05 mg/dL (ref 0.76–1.27)
GFR calc Af Amer: 93 mL/min/{1.73_m2} (ref >59)
GFR calc non Af Amer: 81 mL/min/{1.73_m2} (ref >59)
Glucose: 90 mg/dL (ref 65–99)
Potassium: 4.8 mmol/L (ref 3.5–5.2)
Sodium: 142 mmol/L (ref 134–144)

## 2019-08-16 LAB — HEPATIC FUNCTION PANEL
ALT: 13 IU/L (ref 0–44)
AST: 14 IU/L (ref 0–40)
Albumin: 4.6 g/dL (ref 3.8–4.9)
Alkaline Phosphatase: 81 IU/L (ref 39–117)
Bilirubin Total: 0.5 mg/dL (ref 0.0–1.2)
Bilirubin, Direct: 0.14 mg/dL (ref 0.00–0.40)
Total Protein: 7.4 g/dL (ref 6.0–8.5)

## 2019-08-16 LAB — LIPID PANEL
Chol/HDL Ratio: 3.7 ratio (ref 0.0–5.0)
Cholesterol, Total: 243 mg/dL — ABNORMAL HIGH (ref 100–199)
HDL: 65 mg/dL (ref >39)
LDL Chol Calc (NIH): 154 mg/dL — ABNORMAL HIGH (ref 0–99)
Triglycerides: 138 mg/dL (ref 0–149)
VLDL Cholesterol Cal: 24 mg/dL (ref 5–40)

## 2019-08-16 LAB — TSH: TSH: 1.9 u[IU]/mL (ref 0.450–4.500)

## 2019-10-03 ENCOUNTER — Encounter: Payer: Self-pay | Admitting: Pulmonary Disease

## 2019-10-03 ENCOUNTER — Other Ambulatory Visit: Payer: Self-pay

## 2019-10-03 ENCOUNTER — Ambulatory Visit: Payer: 59 | Admitting: Pulmonary Disease

## 2019-10-03 DIAGNOSIS — J849 Interstitial pulmonary disease, unspecified: Secondary | ICD-10-CM | POA: Diagnosis not present

## 2019-10-03 NOTE — Progress Notes (Addendum)
Timothy Contreras    [id number]    Dec 20, 1965  Primary Care Physician:Aguiar, Wynonia Musty, MD  Referring Physician: Robyne Peers, MD [address] [place],  [place] [address]  Chief complaint: Assessment for cough, abnormal CT  HPI: 53 year old with hyperlipidemia, hypertension.  Complains of intermittent dyspnea, cough since April 2020.  Cough is associated with light colored mucus. He had chest x-ray as outpatient that showed right lower lobe scarring and CT coronaries which showed some opacities in the right lung consistent with atelectasis and has been referred here for further evaluation  Pets: Has dogs.  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  Was in [employer] at [place] in early 2010 Exposures: Exposure to burning fire pattern [place].  No ongoing exposure.  Denies any mold, hot tub, Jacuzzi, feather pillows or comforters or humidifier Smoking history: Never smoker Travel history: Originally from [place].  Moved to [place] in 1995 Relevant family history: No significant family history of lung disease  Outpatient Encounter Medications as of 10/03/2019  Medication Sig  . amLODipine (NORVASC) 2.5 MG tablet Take 2.5 mg by mouth daily.   Marland Kitchen Dexlansoprazole (DEXILANT PO) Take 60 mg by mouth daily.   . famotidine (PEPCID) 40 MG tablet Take 40 mg by mouth at bedtime.   Marland Kitchen losartan (COZAAR) 50 MG tablet Take 50 mg by mouth daily.    No facility-administered encounter medications on file as of 10/03/2019.     Allergies as of 10/03/2019 - Review Complete 10/03/2019  Allergen Reaction Noted  . Other Nausea And Vomiting and Hives 12/11/2015  . Ivp dye [iodinated diagnostic agents]  03/10/2018  . Iodine-131 Rash 10/29/2015    Past Medical History:  Diagnosis Date  . GERD (gastroesophageal reflux disease)   . Hypertension     Past Surgical History:  Procedure Laterality Date  . TONSILLECTOMY       History reviewed. No pertinent family history.  Social History   Socioeconomic History  . Marital status: Married    Spouse name: Not on file  . Number of children: Not on file  . Years of education: Not on file  . Highest education level: Not on file  Occupational History  . Not on file  Social Needs  . Financial resource strain: Not on file  . Food insecurity    Worry: Not on file    Inability: Not on file  . Transportation needs    Medical: Not on file    Non-medical: Not on file  Tobacco Use  . Smoking status: Never Smoker  . Smokeless tobacco: Never Used  Substance and Sexual Activity  . Alcohol use: Yes  . Drug use: Never  . Sexual activity: Not on file  Lifestyle  . Physical activity    Days per week: Not on file    Minutes per session: Not on file  . Stress: Not on file  Relationships  . Social Herbalist on phone: Not on file    Gets together: Not on file    Attends religious service: Not on file    Active member of club or organization: Not on file    Attends meetings of clubs or organizations: Not on file    Relationship status: Not on file  . Intimate partner violence    Fear of current or ex partner: Not on file    Emotionally abused: Not on  file    Physically abused: Not on file    Forced sexual activity: Not on file  Other Topics Concern  . Not on file  Social History Narrative  . Not on file    Review of systems: Review of Systems  Constitutional: Negative for fever and chills.  HENT: Negative.   Eyes: Negative for blurred vision.  Respiratory: as per HPI  Cardiovascular: Negative for chest pain and palpitations.  Gastrointestinal: Negative for vomiting, diarrhea, blood per rectum. Genitourinary: Negative for dysuria, urgency, frequency and hematuria.  Musculoskeletal: Negative for myalgias, back pain and joint pain.  Skin: Negative for itching and rash.  Neurological: Negative for dizziness, tremors, focal weakness,  seizures and loss of consciousness.  Endo/Heme/Allergies: Negative for environmental allergies.  Psychiatric/Behavioral: Negative for depression, suicidal ideas and hallucinations.  All other systems reviewed and are negative.  Physical Exam: Blood pressure 124/80, pulse 80, temperature 97.6 F (36.4 C), temperature source Temporal, height 5\' 7"  (1.702 m), weight 184 lb 12.8 oz (83.8 kg), SpO2 97 %. Gen:      No acute distress HEENT:  EOMI, sclera anicteric Neck:     No masses; no thyromegaly Lungs:    Clear to auscultation bilaterally; normal respiratory effort CV:         Regular rate and rhythm; no murmurs Abd:      + bowel sounds; soft, non-tender; no palpable masses, no distension Ext:    No edema; adequate peripheral perfusion Skin:      Warm and dry; no rash Neuro: alert and oriented x 3 Psych: normal mood and affect  Data Reviewed: Imaging: CXR 05/21/19 Slight scarring right base. No edema or consolidation. Stable cardiac silhouette.  CT coronaries 06/27/2019-coronary calcium "score of 0.  No evidence of coronary artery disease Visualized lung fields shows atelectasis in the right lower and right middle lobe and benign appearing hepatic cyst.  I have reviewed the images personally.  Assessment:  Abnormal lung imaging Concern for right lower lobe opacity/scarring We will get high-res CT for further evaluation Follow-up in 2 weeks to review scan and plan for next steps.  Plan/Recommendations: - High-resolution CT  08/27/2019 MD [facility] 10/03/2019, 10:18 AM  CC: 13/09/2019, MD

## 2019-10-03 NOTE — Patient Instructions (Signed)
I have reviewed your imaging today which shows atelectasis in the right lung which is the third time meeting the lung is not expanded for the I do not see any clear evidence of lung scarring.  We will get a high-resolution CT of the chest for better evaluation of the lungs Follow-up in 2 weeks.

## 2019-10-12 ENCOUNTER — Other Ambulatory Visit: Payer: Self-pay

## 2019-10-12 ENCOUNTER — Ambulatory Visit (INDEPENDENT_AMBULATORY_CARE_PROVIDER_SITE_OTHER)
Admission: RE | Admit: 2019-10-12 | Discharge: 2019-10-12 | Disposition: A | Discharge status: Home / Self Care | Payer: 59 | Source: Ambulatory Visit | Attending: Pulmonary Disease | Admitting: Pulmonary Disease

## 2019-10-12 DIAGNOSIS — J849 Interstitial pulmonary disease, unspecified: Secondary | ICD-10-CM

## 2019-10-31 ENCOUNTER — Other Ambulatory Visit: Payer: Self-pay

## 2019-10-31 ENCOUNTER — Encounter: Payer: Self-pay | Admitting: Pulmonary Disease

## 2019-10-31 ENCOUNTER — Ambulatory Visit: Payer: 59 | Admitting: Pulmonary Disease

## 2019-10-31 DIAGNOSIS — J849 Interstitial pulmonary disease, unspecified: Secondary | ICD-10-CM | POA: Diagnosis not present

## 2019-10-31 NOTE — Progress Notes (Signed)
         Timothy Contreras    [id number]    03-22-1966  Primary Care Physician:Aguiar, Wynonia Musty, MD  Referring Physician: Robyne Peers, MD [address] [place],  [place] [address]  Chief complaint: Assessment for cough, abnormal CT  HPI: 53 year old with hyperlipidemia, hypertension.  Complains of intermittent dyspnea, cough since April 2020.  Cough is associated with light colored mucus. He had chest x-ray as outpatient that showed right lower lobe scarring and CT coronaries which showed some opacities in the right lung consistent with atelectasis and has been referred here for further evaluation  Pets: Has dogs.  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  Was in [employer] at [place] in early 2010 Exposures: Exposure to burning fire pattern [place].  No ongoing exposure.  Denies any mold, hot tub, Jacuzzi, feather pillows or comforters or humidifier Smoking history: Never smoker Travel history: Originally from [place].  Moved to [place] in 1995 Relevant family history: No significant family history of lung disease  Interval history: States that breathing is doing well with no issues Here for review of CT chest  Outpatient Encounter Medications as of 10/31/2019  Medication Sig  . amLODipine (NORVASC) 2.5 MG tablet Take 2.5 mg by mouth daily.   Marland Kitchen Dexlansoprazole (DEXILANT PO) Take 60 mg by mouth daily.   . famotidine (PEPCID) 40 MG tablet Take 40 mg by mouth at bedtime.   Marland Kitchen losartan (COZAAR) 50 MG tablet Take 50 mg by mouth daily.    No facility-administered encounter medications on file as of 10/31/2019.    Physical Exam: Blood pressure 120/84, pulse 70, temperature 97.6 F (36.4 C), temperature source Temporal, height 5\' 8"  (1.727 m), weight 191 lb (86.6 kg), SpO2 98 %. Gen:      No acute distress HEENT:  EOMI, sclera anicteric Neck:     No masses; no thyromegaly Lungs:    Clear to auscultation  bilaterally; normal respiratory effort CV:         Regular rate and rhythm; no murmurs Abd:      + bowel sounds; soft, non-tender; no palpable masses, no distension Ext:    No edema; adequate peripheral perfusion Skin:      Warm and dry; no rash Neuro: alert and oriented x 3 Psych: normal mood and affect  Data Reviewed: Imaging: CT coronaries 06/27/2019-coronary calcium "score of 0.  No evidence of coronary artery disease Visualized lung fields shows atelectasis in the right lower and right middle lobe and benign appearing hepatic cyst.    CT high-resolution 10/12/2019-no evidence of ILD, elevated right hemidiaphragm with chronic atelectasis, mild air trapping, calcified granuloma in the left upper lobe.  Other pulmonary nodules measuring 40 m or less. I reviewed the images personally.  Assessment:  Abnormal lung imaging CT chest reviewed with no evidence of interstitial lung disease.  There is no evidence of interstitial lung disease.  However he does have an elevated right hemidiaphragm with associated atelectasis and mild bronchiectasis  Reason for hemidiaphragm duration is unclear.  He denies any neck or chest surgery trauma.  I assured him that findings are benign.  We will give him a flutter valve and incentive spirometry.  Lung nodules Follow-up CT in 6 months  Plan/Recommendations: -Follow-up CT in 6 months  Marshell Garfinkel MD [facility] 10/31/2019, 11:45 AM  CC: Robyne Peers, MD

## 2019-10-31 NOTE — Patient Instructions (Signed)
CT scan shows no evidence of interstitial lung disease. His right diaphragm is elevated causing something called atelectasis which is a benign finding. He also has small lung nodules which are likely benign   We will give you a device called an incentive spirometer that can help you reexpand your lungs.  Use this at least twice a day in the morning and evening We will order CT chest without contrast in 6 months and follow-up in clinic after that.

## 2019-12-26 ENCOUNTER — Telehealth: Payer: Self-pay | Admitting: Pulmonary Disease

## 2019-12-26 NOTE — Telephone Encounter (Signed)
Order placed 12/9 for pt to receive incentive spirometer from DME. Pt calling requesting status on this. PCCs, can you help [place] out with this please?

## 2019-12-27 NOTE — Telephone Encounter (Signed)
Attempted to call pt but unable to reach. Left message for pt to return call. 

## 2019-12-27 NOTE — Telephone Encounter (Signed)
Type Date User Summary Attachment  General 11/01/2019 2:11 PM Lanna Poche D - -  Note   Lanna Poche D sent to Druscilla Brownie  I will note on order. You will need to call in & open a telephone message.   Thank you.      Previous Messages   ----- Message -----  From: Druscilla Brownie  Sent: 11/01/2019  1:06 PM EST  To: Durward Fortes, Druscilla Brownie, *  Subject: RE: incentive spirometer             The unit is not on contract as covered item. Unable to provide for this patient.          . Type Date User Summary Attachment  General 10/31/2019 5:13 PM Lanna Poche D - -  Note   CM sent to [facility].          . Type Date User Summary Attachment  General 10/31/2019 12:01 PM Lanna Poche D - -  Note   Holding for signature.         . Type Date User Summary Attachment  Provider Comments 10/31/2019 11:55 AM Ladona Ridgel Conni Elliot, CMA Provider Comments -  Note   DME: Any  Please provide patient with incentive spirometer.  Length of need: Lifetime

## 2019-12-27 NOTE — Telephone Encounter (Signed)
Yes sent to [facility].

## 2019-12-27 NOTE — Telephone Encounter (Signed)
lmtcb for pt. Timothy Contreras never called in to let us know they could not provide this for the pt.   PCC's can we send this to another DME? Will you need a new order?

## 2019-12-27 NOTE — Telephone Encounter (Signed)
Patient is returning phone call.  Patient phone number is [phone].

## 2019-12-27 NOTE — Telephone Encounter (Signed)
Pt returning phone call ° °

## 2019-12-27 NOTE — Telephone Encounter (Signed)
[facility] has rec'd & is working on this order.

## 2019-12-27 NOTE — Telephone Encounter (Signed)
Spoke with pt. Informed him of the update with [facility] and how we are looking into another DME to get his IS. Will keep message open until pt receives his IS.

## 2019-12-28 NOTE — Telephone Encounter (Signed)
Spoke with pt, he states he spoke to [employer] and they incentive spirometer is on the way. Nothing further is needed.

## 2020-01-11 ENCOUNTER — Encounter: Payer: Self-pay | Admitting: Cardiology

## 2020-01-11 ENCOUNTER — Telehealth (INDEPENDENT_AMBULATORY_CARE_PROVIDER_SITE_OTHER): Payer: 59 | Admitting: Cardiology

## 2020-01-11 VITALS — BP 135/95 | HR 76 | Ht 68.0 in | Wt 191.0 lb

## 2020-01-11 DIAGNOSIS — I10 Essential (primary) hypertension: Secondary | ICD-10-CM | POA: Diagnosis not present

## 2020-01-11 DIAGNOSIS — E782 Mixed hyperlipidemia: Secondary | ICD-10-CM | POA: Diagnosis not present

## 2020-01-11 DIAGNOSIS — Z1329 Encounter for screening for other suspected endocrine disorder: Secondary | ICD-10-CM

## 2020-01-11 DIAGNOSIS — E7800 Pure hypercholesterolemia, unspecified: Secondary | ICD-10-CM

## 2020-01-11 NOTE — Addendum Note (Signed)
Addended by: Eleonore Chiquito on: 01/11/2020 02:26 PM   Modules accepted: Orders

## 2020-01-11 NOTE — Patient Instructions (Signed)
Medication Instructions:  No changes *If you need a refill on your cardiac medications before your next appointment, please call your pharmacy*  Lab Work: You need to have fasting labs drawn. You can go to the office Monday - Friday 8:30 to 12:00 and 1:15 to 4:30. You do not need an appointment If you have labs (blood work) drawn today and your tests are completely normal, you will receive your results only by: Marland Kitchen MyChart Message (if you have MyChart) OR . A paper copy in the mail If you have any lab test that is abnormal or we need to change your treatment, we will call you to review the results.  Testing/Procedures: None ordered  Follow-Up: At [facility], you and your health needs are our priority.  As part of our continuing mission to provide you with exceptional heart care, we have created designated Provider Care Teams.  These Care Teams include your primary Cardiologist (physician) and Advanced Practice Providers (APPs -  Physician Assistants and Nurse Practitioners) who all work together to provide you with the care you need, when you need it.  Your next appointment:   6 month(s)  The format for your next appointment:   In Person  Provider:   Belva Crome, MD  Other Instructions NA

## 2020-01-11 NOTE — Progress Notes (Signed)
Virtual Visit via Video Note   This visit type was conducted due to national recommendations for restrictions regarding the COVID-19 Pandemic (e.g. social distancing) in an effort to limit this patient's exposure and mitigate transmission in our community.  Due to his co-morbid illnesses, this patient is at least at moderate risk for complications without adequate follow up.  This format is felt to be most appropriate for this patient at this time.  All issues noted in this document were discussed and addressed.  A limited physical exam was performed with this format.  Please refer to the patient's chart for his consent to telehealth for [facility].   Date:  01/11/2020   ID:  Timothy Contreras, DOB 04-02-1966, MRN [id number]  Patient Location: Home Provider Location: Office  PCP:  Robyne Peers, MD  Cardiologist:  No primary care provider on file.  Electrophysiologist:  None   Evaluation Performed:  Follow-Up Visit  Chief Complaint: Follow-up visit  History of Present Illness:    Timothy Contreras is a 54 y.o. male with past medical history of essential hypertension and dyslipidemia.  He denies any problems at this time and takes care of activities of daily living.  No chest pain orthopnea or PND.  He had some issues with shortness of breath and was told by his pulmonologist that that was because of her elevated diaphragm.  Subsequently is done fine he exercises on a regular basis.  He is a Engineer, structural by profession.  At the time of my evaluation, the patient is alert awake oriented and in no distress.  The patient does not have symptoms concerning for COVID-19 infection (fever, chills, cough, or new shortness of breath).    Past Medical History:  Diagnosis Date  . GERD (gastroesophageal reflux disease)   . Hypertension    Past Surgical History:  Procedure Laterality Date  . TONSILLECTOMY       Current Meds  Medication Sig  . amLODipine (NORVASC) 2.5 MG tablet Take 2.5 mg  by mouth daily.   Marland Kitchen Dexlansoprazole (DEXILANT PO) Take 60 mg by mouth daily.   Marland Kitchen losartan (COZAAR) 50 MG tablet Take 50 mg by mouth daily.      Allergies:   Other, Ivp dye [iodinated diagnostic agents], and Iodine-131   Social History   Tobacco Use  . Smoking status: Never Smoker  . Smokeless tobacco: Never Used  Substance Use Topics  . Alcohol use: Yes  . Drug use: Never     Family Hx: The patient's family history is not on file.  ROS:   Please see the history of present illness.    As mentioned above All other systems reviewed and are negative.   Prior CV studies:   The following studies were reviewed today:  Calcium score was 0 and I reviewed the study with the patient  Labs/Other Tests and Data Reviewed:    EKG:  EKG from previous evaluation was reviewed  Recent Labs: 08/15/2019: ALT 13; BUN 14; Creatinine, Ser 1.05; Hemoglobin 16.4; Platelets 203; Potassium 4.8; Sodium 142; TSH 1.900   Recent Lipid Panel Lab Results  Component Value Date/Time   CHOL 243 (H) 08/15/2019 09:07 AM   TRIG 138 08/15/2019 09:07 AM   HDL 65 08/15/2019 09:07 AM   CHOLHDL 3.7 08/15/2019 09:07 AM   LDLCALC 154 (H) 08/15/2019 09:07 AM    Wt Readings from Last 3 Encounters:  01/11/20 191 lb (86.6 kg)  10/31/19 191 lb (86.6 kg)  10/03/19 184 lb  12.8 oz (83.8 kg)     Objective:    Vital Signs:  BP (!) 135/95   Pulse 76   Ht 5\' 8"  (1.727 m)   Wt 191 lb (86.6 kg)   BMI 29.04 kg/m    VITAL SIGNS:  reviewed  ASSESSMENT & PLAN:    1. Essential hypertension: I discussed my findings with the patient extensively.  Lifestyle modification was also discussed.  I told him to keep a track of his blood pressures twice a day and he will bring it to me in a week's time.  Importance of regular exercise stressed walking at least half an hour a day at least 5 days a week and he promises to do so.  Salt issues were discussed diet was discussed. 2. Mixed dyslipidemia: Patient's lipids will be  reviewed.  He is doing better with his diet he will come back in the next few days for blood work which will be fasting and will do a Chem-7 CBC liver lipid check and TSH. 3. Patient will be seen in follow-up appointment in 6 months or earlier if the patient has any concerns 4.   COVID-19 Education: The signs and symptoms of COVID-19 were discussed with the patient and how to seek care for testing (follow up with PCP or arrange E-visit).  The importance of social distancing was discussed today.  Time:   Today, I have spent 15 minutes with the patient with telehealth technology discussing the above problems.     Medication Adjustments/Labs and Tests Ordered: Current medicines are reviewed at length with the patient today.  Concerns regarding medicines are outlined above.   Tests Ordered: No orders of the defined types were placed in this encounter.   Medication Changes: No orders of the defined types were placed in this encounter.   Follow Up: 6 months follow-up  Signed, , MD  01/11/2020 2:07 PM    [facility]

## 2020-04-15 ENCOUNTER — Ambulatory Visit: Payer: 59 | Admitting: Pulmonary Disease

## 2020-04-15 ENCOUNTER — Encounter: Payer: Self-pay | Admitting: Pulmonary Disease

## 2020-04-15 ENCOUNTER — Other Ambulatory Visit: Payer: Self-pay

## 2020-04-15 VITALS — BP 124/72 | HR 64 | Temp 97.8°F | Ht 68.0 in | Wt 189.4 lb

## 2020-04-15 DIAGNOSIS — R0600 Dyspnea, unspecified: Secondary | ICD-10-CM | POA: Diagnosis not present

## 2020-04-15 NOTE — Progress Notes (Signed)
         Timothy Contreras    [id number]    [birth year]  Primary Care Physician:Aguiar, Genia Del, MD  Referring Physician: Angelica Chessman, MD [address] [place],  [place] [address]  Chief complaint: Assessment for cough, abnormal CT  HPI: 54 year old with hyperlipidemia, hypertension.  Complains of intermittent dyspnea, cough since April 2020.  Cough is associated with light colored mucus. He had chest x-ray as outpatient that showed right lower lobe scarring and CT coronaries which showed some opacities in the right lung consistent with atelectasis and has been referred here for further evaluation  Pets: Has dogs.  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  No ongoing exposure.  Denies any mold, hot tub, Jacuzzi, feather pillows or comforters or humidifier Smoking history: Never smoker Travel history: Originally from [place].  Moved to [place] in 1995 Relevant family history: No significant family history of lung disease  Interval history: States that breathing is doing well with no issues His wife has noted occasional wheezing.  Outpatient Encounter Medications as of 04/15/2020  Medication Sig  . amLODipine (NORVASC) 2.5 MG tablet Take 2.5 mg by mouth daily.   Marland Kitchen Dexlansoprazole (DEXILANT PO) Take 60 mg by mouth daily.   . famotidine (PEPCID) 40 MG tablet Take 40 mg by mouth at bedtime.   Marland Kitchen losartan (COZAAR) 50 MG tablet Take 50 mg by mouth daily.    No facility-administered encounter medications on file as of 04/15/2020.   Physical Exam: Blood pressure 124/72, pulse 64, temperature 97.8 F (36.6 C), temperature source Oral, height 5\' 8"  (1.727 m), weight 189 lb 6.4 oz (85.9 kg), SpO2 97 %. Gen:      No acute distress HEENT:  EOMI, sclera anicteric Neck:     No masses; no thyromegaly Lungs:    Clear to  auscultation bilaterally; normal respiratory effort CV:         Regular rate and rhythm; no murmurs Abd:      + bowel sounds; soft, non-tender; no palpable masses, no distension Ext:    No edema; adequate peripheral perfusion Skin:      Warm and dry; no rash Neuro: alert and oriented x 3 Psych: normal mood and affect  Data Reviewed: Imaging: CT coronaries 06/27/2019-coronary calcium "score of 0.  No evidence of coronary artery disease Visualized lung fields shows atelectasis in the right lower and right middle lobe and benign appearing hepatic cyst.    CT high-resolution 10/12/2019-no evidence of ILD, elevated right hemidiaphragm with chronic atelectasis, mild air trapping, calcified granuloma in the left upper lobe.  Other pulmonary nodules measuring 53mm or less. I reviewed the images personally.  Assessment:  Abnormal lung imaging CT chest reviewed with no evidence of interstitial lung disease.  There is no evidence of interstitial lung disease.  However he does have an elevated right hemidiaphragm with associated atelectasis and mild bronchiectasis  Reason for hemidiaphragm duration is unclear.  He denies any neck or chest surgery trauma.  I assured him that findings are benign.  Continue flutter valve and incentive spirometry.  Lung nodules Follow-up CT scheduled for June  Plan/Recommendations: -Follow-up CT  July MD [facility] 04/15/2020, 8:46 AM  CC: 04/17/2020, MD

## 2020-04-15 NOTE — Patient Instructions (Addendum)
I am glad you are doing well with regard to your breathing We discussed your breathing in office today.  If symptoms of wheezing are mild and intermittent we can monitor for now without need for inhalers Use the incentive spirometer 2-3 times a day with several bottles each time You are scheduled for follow-up CT scan on 9 June.  We will be in touch regarding the results Follow-up in clinic in 12 months.

## 2020-04-19 IMAGING — CT CT HEAR MORPH WITH CTA COR WITH SCORE WITH CA WITH CONTRAST AND
4 of 7 series · 8 of 20 positions shown, 9 images · IV contrast (APPLIED)
Comparison: None.
COMPARISON: None.

Addendum:
EXAM:
OVER-READ INTERPRETATION  CT CHEST

The following report is an over-read performed by radiologist Dr.
Ada Eze Elabor [REDACTED] on 06/27/2019. This
over-read does not include interpretation of cardiac or coronary
anatomy or pathology. The coronary CTA interpretation by the
cardiologist is attached.
TECHNIQUE: The patient was scanned on a Phillips Force scanner.

[Series 6: best diast 75 % · axial · 0.39mm/px · z∈[-490,-446]mm · 2 of 331 slices shown, 3 images]
[im 111/331  vessel]
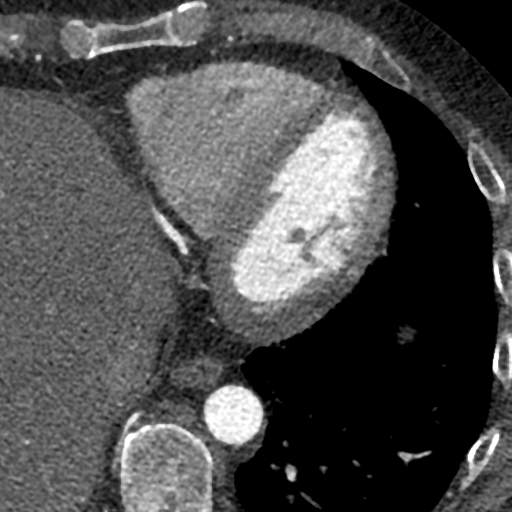
[im 111/331  lung]
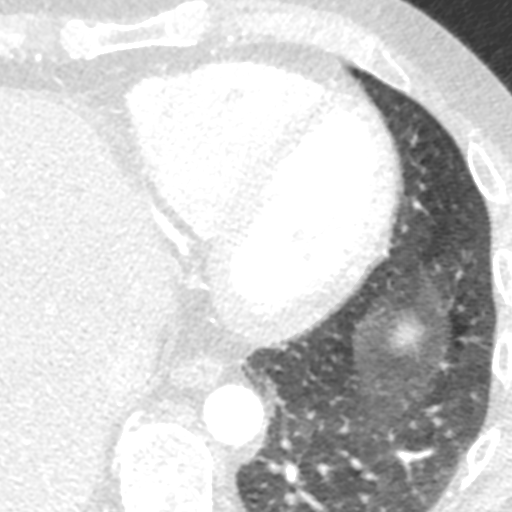
[im 221/331  vessel]
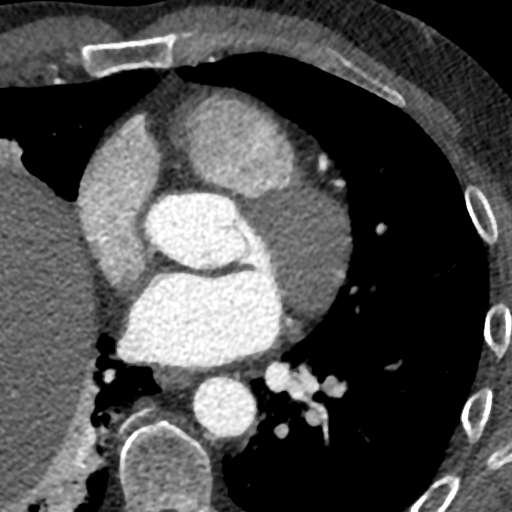

[Series 7: best syst 32 % · axial · 0.39mm/px · z∈[-490,-446]mm · 2 of 331 slices shown]
[im 111/331  vessel]
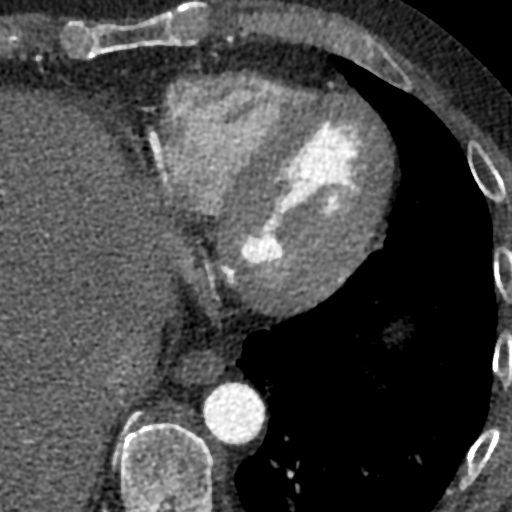
[im 221/331  vessel]
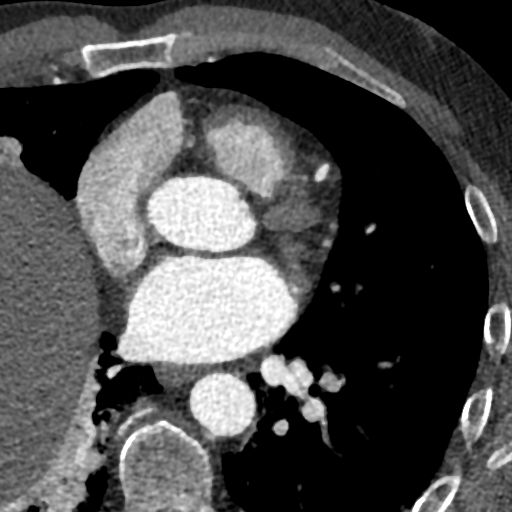

[Series 8: ts diast sharp 75 % · axial · 0.39mm/px · z∈[-490,-446]mm · 2 of 331 slices shown]
[im 111/331  lung]
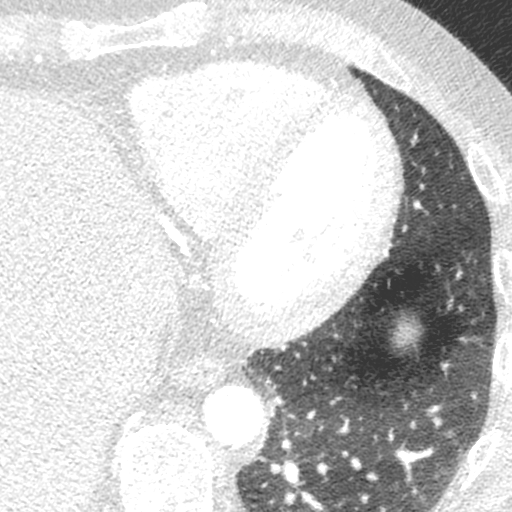
[im 221/331  lung]
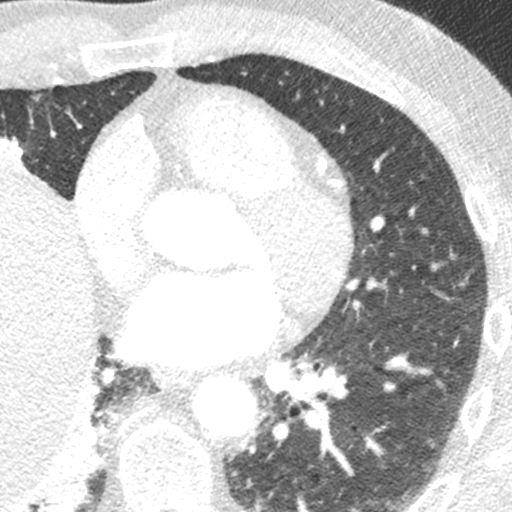

[Series 9: ts syst sharp 32 % · axial · 0.39mm/px · z∈[-490,-446]mm · 2 of 331 slices shown]
[im 111/331  lung]
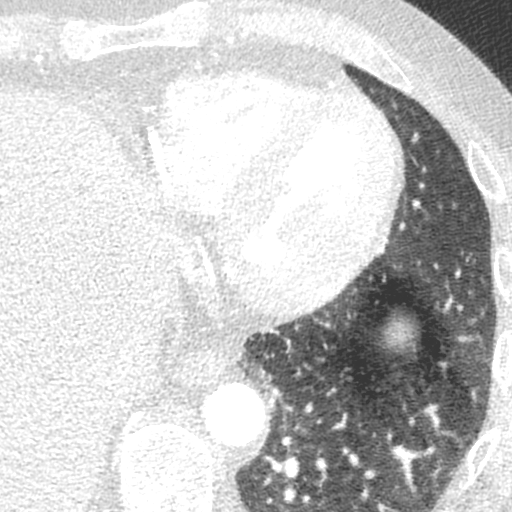
[im 221/331  lung]
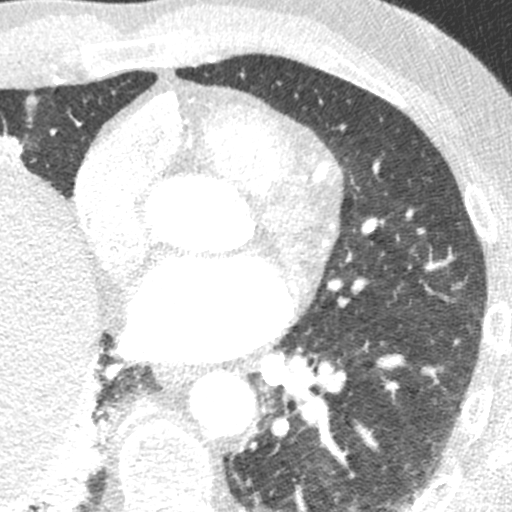

[8 of 20 positions shown; findings below may reference images not displayed]

FINDINGS: Limited view of the lung parenchyma demonstrates atelectasis within
the RIGHT lower lobe and RIGHT middle lobe. Airways are normal.

Limited view of the mediastinum demonstrates no adenopathy.
Esophagus normal.

Limited view of the upper abdomen demonstrates multiple small benign
appearing hepatic cysts.

Limited view of the skeleton and chest wall is unremarkable.
IMPRESSION: 1. RIGHT lower lobe and RIGHT middle lobe atelectasis.
2. Benign appearing hepatic cysts.

EXAM:
Cardiac/Coronary  CT
FINDINGS: A 120 kV prospective scan was triggered in the descending thoracic
aorta at 111 HU's. Axial non-contrast 3 mm slices were carried out
through the heart. The data set was analyzed on a dedicated work
station and scored using the Agatson method. Gantry rotation speed
was 250 msecs and collimation was .6 mm. No beta blockade and 0.8 mg
of sl NTG was given. The 3D data set was reconstructed in 5%
intervals of the 67-82 % of the R-R cycle. Diastolic phases were
analyzed on a dedicated work station using MPR, MIP and VRT modes.
The patient received 80 cc of contrast.

Aorta:  Normal size.  Mild calcifications.  No dissection.

Aortic Valve:  Trileaflet.  No calcifications.

Coronary Arteries:  Normal coronary origin.  Right dominance.

RCA is a large dominant artery that gives rise to PDA and PLVB.
There is no plaque.

Left main is a large artery that gives rise to LAD, Ramus and LCX
arteries. There is no plaque.

LAD is a large vessel that has no plaque. There is motion artifact
in the distal LAD.

Ramus is a moderate sized bifurcating artery.  There is no plaque.

LCX is a non-dominant artery.  There is no plaque.

Other findings:

Normal pulmonary vein drainage into the left atrium.

Normal let atrial appendage without a thrombus.

Normal size of the pulmonary artery.
IMPRESSION: 1. Coronary calcium score of 0. This was 0 percentile for age and
sex matched control.

2. Normal coronary origin with right dominance.

3. No evidence of CAD.

[facility]

*** End of Addendum ***
EXAM:
OVER-READ INTERPRETATION  CT CHEST

The following report is an over-read performed by radiologist Dr.
Ada Eze Elabor [REDACTED] on 06/27/2019. This
over-read does not include interpretation of cardiac or coronary
anatomy or pathology. The coronary CTA interpretation by the
cardiologist is attached.
FINDINGS: Limited view of the lung parenchyma demonstrates atelectasis within
the RIGHT lower lobe and RIGHT middle lobe. Airways are normal.

Limited view of the mediastinum demonstrates no adenopathy.
Esophagus normal.

Limited view of the upper abdomen demonstrates multiple small benign
appearing hepatic cysts.

Limited view of the skeleton and chest wall is unremarkable.
IMPRESSION: 1. RIGHT lower lobe and RIGHT middle lobe atelectasis.
2. Benign appearing hepatic cysts.

## 2020-04-30 ENCOUNTER — Other Ambulatory Visit: Payer: Self-pay

## 2020-04-30 ENCOUNTER — Ambulatory Visit (INDEPENDENT_AMBULATORY_CARE_PROVIDER_SITE_OTHER)
Admission: RE | Admit: 2020-04-30 | Discharge: 2020-04-30 | Disposition: A | Discharge status: Home / Self Care | Payer: 59 | Source: Ambulatory Visit | Attending: Pulmonary Disease | Admitting: Pulmonary Disease

## 2020-04-30 DIAGNOSIS — J849 Interstitial pulmonary disease, unspecified: Secondary | ICD-10-CM

## 2021-02-21 IMAGING — CT CT CHEST W/O CM
2 of 4 series · 15 of 36 positions shown, 18 images · non-contrast
Comparison: CT chest dated 10/12/2019

CLINICAL DATA: Chronic cough, abnormal CT

EXAM:
CT CHEST WITHOUT CONTRAST
TECHNIQUE: Multidetector CT imaging of the chest was performed following the
standard protocol without IV contrast.

[Series 2: thorax · axial · 0.78mm/px · z∈[-279,-27]mm · 12 of 150 slices shown, 15 images]
[im 12/150  mediastinal]
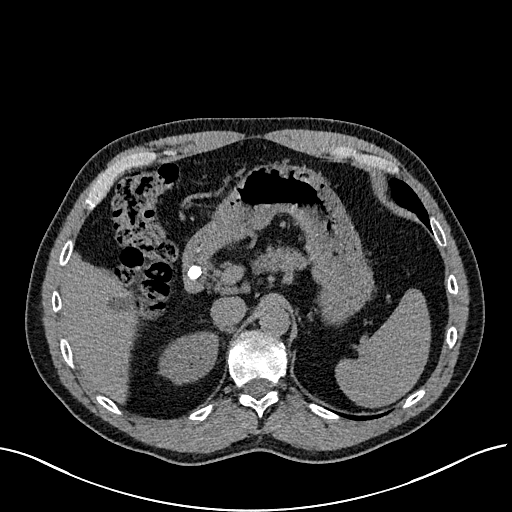
[im 12/150  lung]
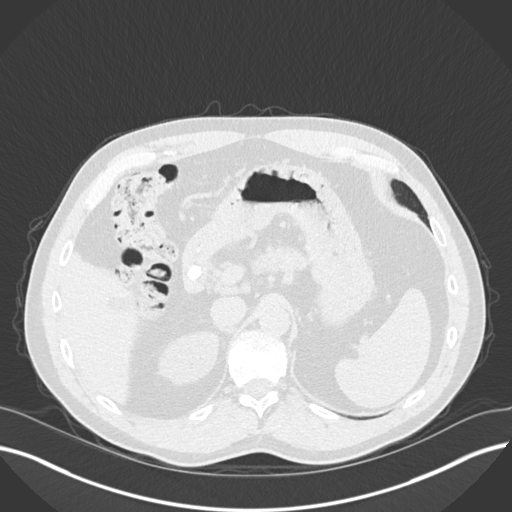
[im 23/150  lung]
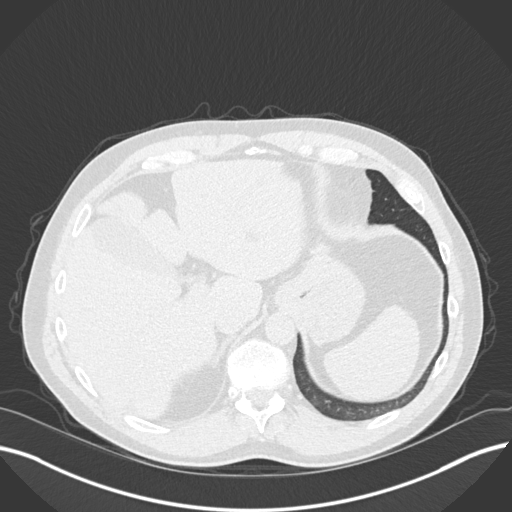
[im 35/150  lung]
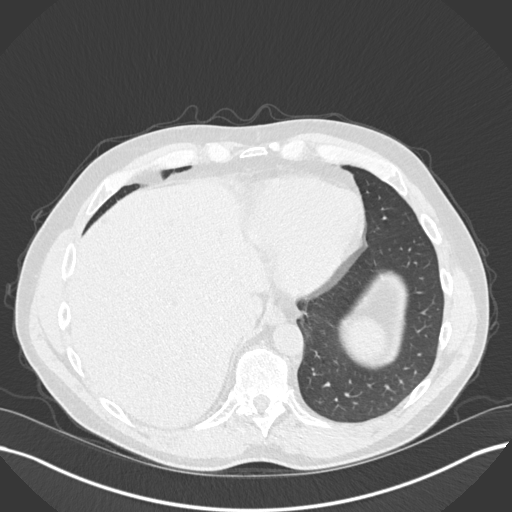
[im 46/150  lung]
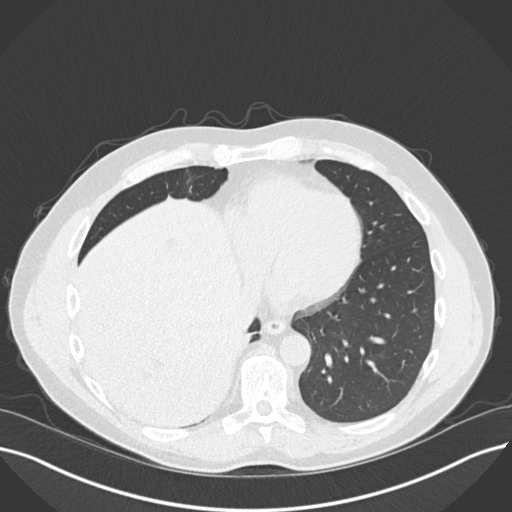
[im 58/150  mediastinal]
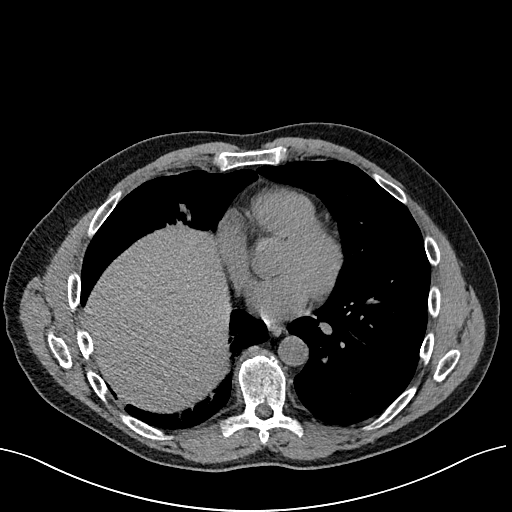
[im 58/150  lung]
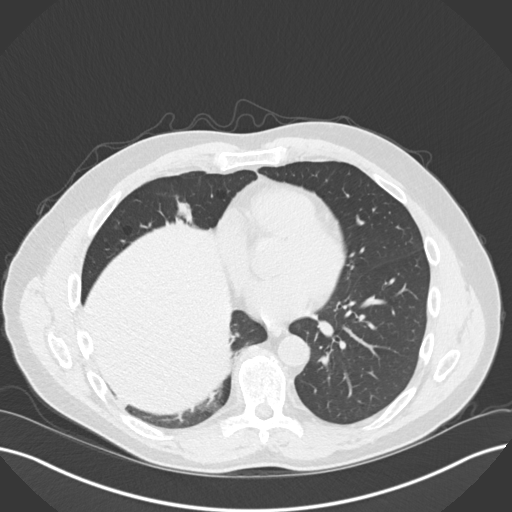
[im 69/150  lung]
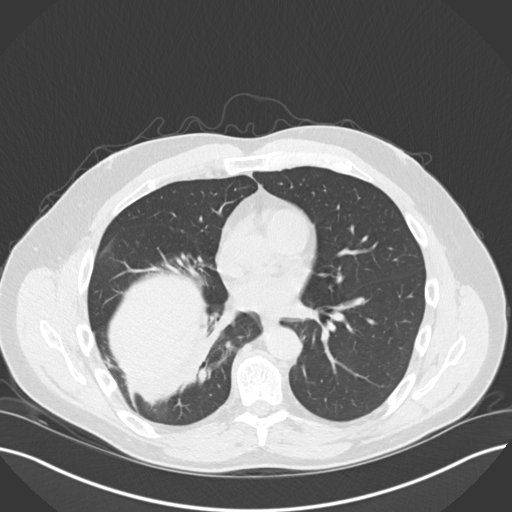
[im 81/150  lung]
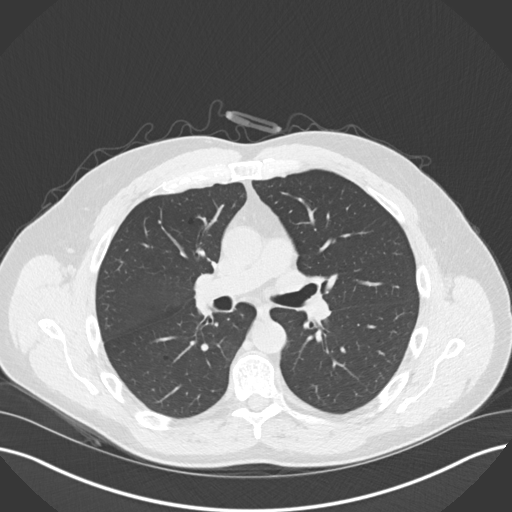
[im 92/150  lung]
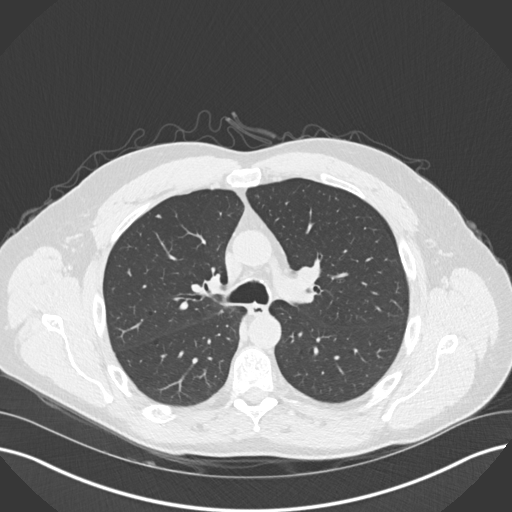
[im 104/150  mediastinal]
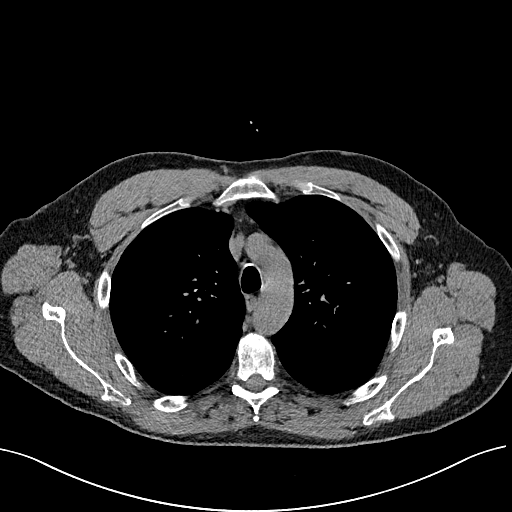
[im 104/150  lung]
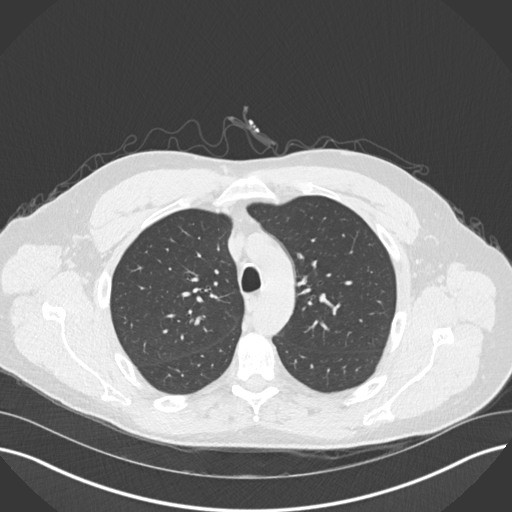
[im 115/150  lung]
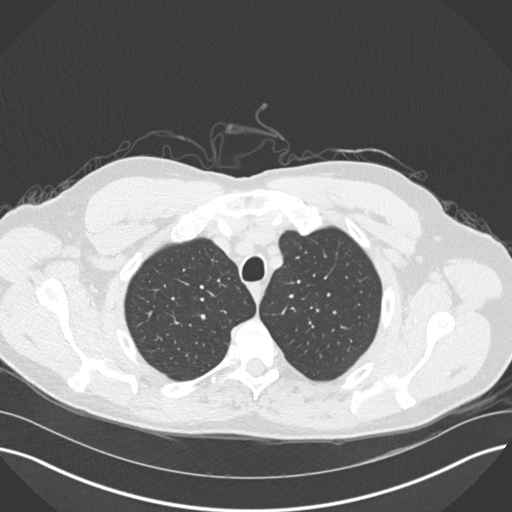
[im 127/150  lung]
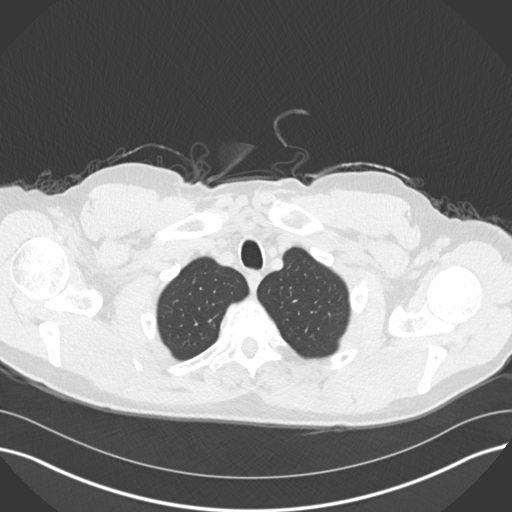
[im 138/150  lung]
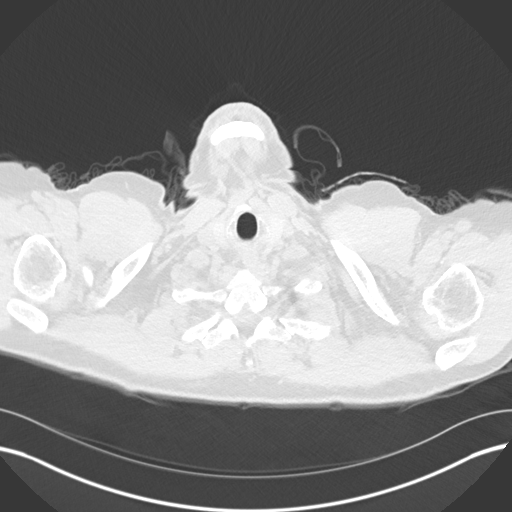

[Series 5: coronal · coronal · 0.62mm/px · 3 of 150 slices shown]
[im 30/150  lung]
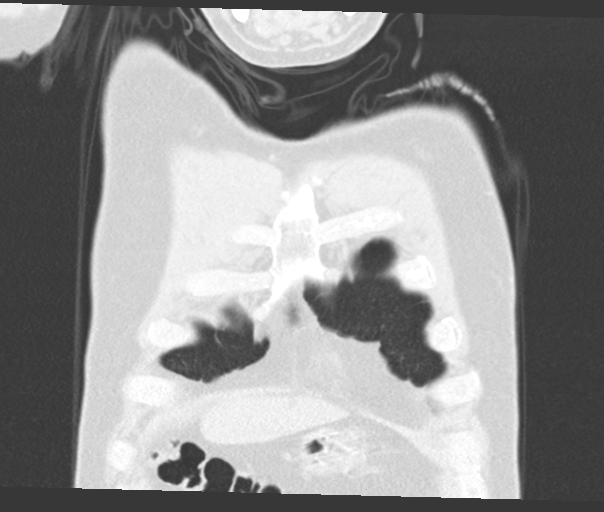
[im 60/150  lung]
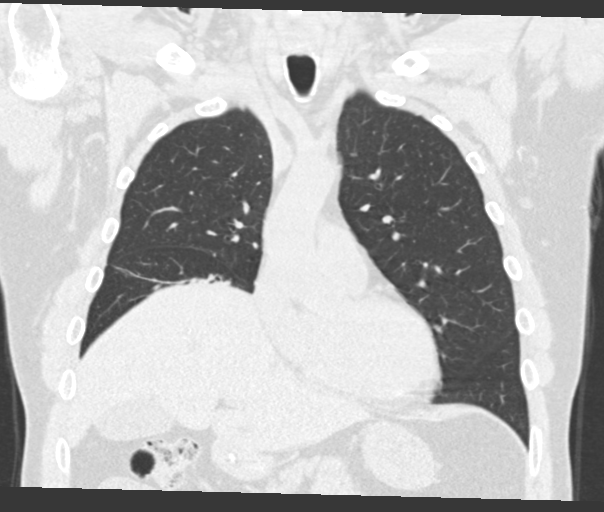
[im 90/150  lung]
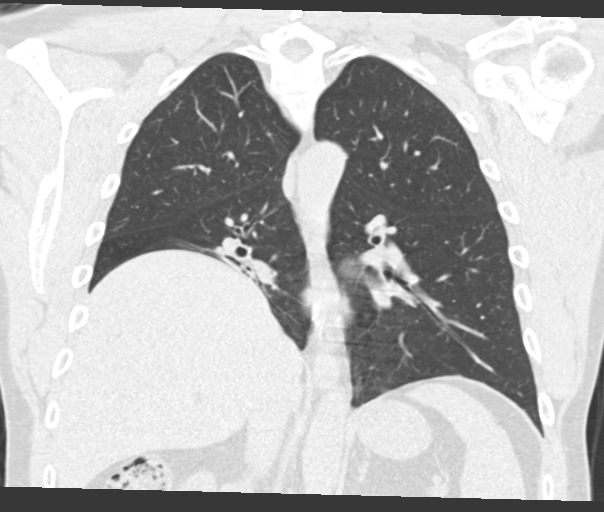

[15 of 36 positions shown; findings below may reference images not displayed]

FINDINGS: Cardiovascular: Heart is normal in size.  No pericardial effusion.

No evidence of thoracic aortic aneurysm.

Mediastinum/Nodes: No suspicious mediastinal lymphadenopathy.

Visualized thyroid is unremarkable.

Lungs/Pleura: Elevated right hemidiaphragm with associated right
middle and right lower lobe scarring/atelectasis.

No focal consolidation.

3 mm triangular subpleural nodule in the posterior left lower lobe
(series 3/image 101). 4 mm nodule in the medial left lower lobe
(series 3/image 89). 7 mm partially calcified granuloma in the
anterior left upper lobe (series 3/image 41), benign. These are
unchanged from 4646.

No new/suspicious pulmonary nodules.

No pleural effusion or pneumothorax.

Upper Abdomen: Visualized upper abdomen is notable for numerous
scattered hepatic cysts.

Musculoskeletal: Mild degenerative changes of the visualized
thoracolumbar spine.
IMPRESSION: Small bilateral pulmonary nodules, unchanged since 4646, benign.
Dedicated follow-up imaging is not required per fractures at a
guidelines. Surface Klever

Elevated right hemidiaphragm with associated right middle lobe and
right lower lobe scarring/atelectasis, chronic.

No evidence of acute cardiopulmonary disease.

## 2022-01-27 ENCOUNTER — Encounter (HOSPITAL_BASED_OUTPATIENT_CLINIC_OR_DEPARTMENT_OTHER): Payer: Self-pay

## 2022-01-27 ENCOUNTER — Other Ambulatory Visit: Payer: Self-pay

## 2022-01-27 ENCOUNTER — Emergency Department (HOSPITAL_BASED_OUTPATIENT_CLINIC_OR_DEPARTMENT_OTHER)
Admission: EM | Admit: 2022-01-27 | Discharge: 2022-01-28 | Disposition: A | Discharge status: Home / Self Care | Payer: 59 | Attending: Emergency Medicine | Admitting: Emergency Medicine

## 2022-01-27 DIAGNOSIS — U071 COVID-19: Secondary | ICD-10-CM | POA: Diagnosis not present

## 2022-01-27 DIAGNOSIS — Z79899 Other long term (current) drug therapy: Secondary | ICD-10-CM | POA: Diagnosis not present

## 2022-01-27 DIAGNOSIS — R059 Cough, unspecified: Secondary | ICD-10-CM | POA: Diagnosis present

## 2022-01-27 DIAGNOSIS — I10 Essential (primary) hypertension: Secondary | ICD-10-CM | POA: Insufficient documentation

## 2022-01-27 LAB — RESP PANEL BY RT-PCR (FLU A&B, COVID) ARPGX2
Influenza A by PCR: NEGATIVE
Influenza B by PCR: NEGATIVE
SARS Coronavirus 2 by RT PCR: POSITIVE — AB

## 2022-01-27 NOTE — ED Triage Notes (Signed)
Pt c/o cough x today with +covid exposure-NAD-steady gait ?

## 2022-01-28 LAB — BASIC METABOLIC PANEL
Anion gap: 9 (ref 5–15)
BUN: 14 mg/dL (ref 6–20)
CO2: 22 mmol/L (ref 22–32)
Calcium: 9.4 mg/dL (ref 8.9–10.3)
Chloride: 104 mmol/L (ref 98–111)
Creatinine, Ser: 1.23 mg/dL (ref 0.61–1.24)
GFR, Estimated: >60 mL/min (ref >60)
Glucose, Bld: 115 mg/dL — ABNORMAL HIGH (ref 70–99)
Potassium: 4.1 mmol/L (ref 3.5–5.1)
Sodium: 135 mmol/L (ref 135–145)

## 2022-01-28 MED ORDER — NIRMATRELVIR/RITONAVIR (PAXLOVID)TABLET: 3.0000 | ORAL_TABLET | Freq: Two times a day (BID) | ORAL | 0 refills | Status: DC

## 2022-01-28 MED ORDER — BENZONATATE 100 MG PO CAPS
200.0000 mg | ORAL_CAPSULE | Freq: Once | ORAL | Status: AC
  Administered 2022-01-28: 02:00:00 200 mg via ORAL
  Filled 2022-01-28: qty 2

## 2022-01-28 MED ORDER — ACETAMINOPHEN 325 MG PO TABS
650.0000 mg | ORAL_TABLET | Freq: Once | ORAL | Status: AC
  Administered 2022-01-28: 02:00:00 650 mg via ORAL
  Filled 2022-01-28: qty 2

## 2022-01-28 MED ORDER — BENZONATATE 100 MG PO CAPS: 200.0000 mg | ORAL_CAPSULE | Freq: Three times a day (TID) | ORAL | 0 refills | Status: DC | PRN

## 2022-01-28 MED ORDER — NIRMATRELVIR/RITONAVIR (PAXLOVID)TABLET: 3.0000 | ORAL_TABLET | Freq: Two times a day (BID) | ORAL | 0 refills | Status: AC

## 2022-01-28 NOTE — ED Provider Notes (Signed)
?[facility] EMERGENCY DEPARTMENT ?Provider Note ? ? ?CSN: [id number] ?Arrival date & time: 01/27/22  2249 ? ?  ? ?History ? ?Chief Complaint  ?Patient presents with  ? Cough  ? ? ?Timothy Contreras is a 56 y.o. male. ? ?The history is provided by the patient and medical records.  ?Cough ?Timothy Contreras is a 56 y.o. male who presents to the Emergency Department complaining of cough.  He presents to the emergency department for evaluation of acute on chronic cough.  He has a chronic cough but acutely worsened today.  He has been having episodes of associated posttussive emesis.  Cough is dry nonproductive.  He did have chills earlier today, no recorded fevers.  His grandson did test positive for COVID-19 yesterday. ?He has a history of hypertension. ?  ? ?Home Medications ?Prior to Admission medications   ?Medication Sig Start Date End Date Taking? Authorizing Provider  ?benzonatate (TESSALON) 100 MG capsule Take 2 capsules (200 mg total) by mouth 3 (three) times daily as needed for cough. 01/28/22  Yes Tilden Fossa, MD  ?amLODipine (NORVASC) 2.5 MG tablet Take 2.5 mg by mouth daily.  09/25/19 04/15/20  [provider]  ?Dexlansoprazole (DEXILANT PO) Take 60 mg by mouth daily.     [provider]  ?famotidine (PEPCID) 40 MG tablet Take 40 mg by mouth at bedtime.     [provider]  ?losartan (COZAAR) 50 MG tablet Take 50 mg by mouth daily.     [provider]  ?nirmatrelvir/ritonavir EUA (PAXLOVID) 20 x 150 MG & 10 x 100MG  TABS Take 3 tablets by mouth 2 (two) times daily for 5 days. 01/28/22 02/02/22  02/04/22, MD  ?   ? ?Allergies    ?Other, Ivp dye [iodinated contrast media], and Iodine-131   ? ?Review of Systems   ?Review of Systems  ?Respiratory:  Positive for cough.   ?All other systems reviewed and are negative. ? ?Physical Exam ?Updated Vital Signs ?BP (!) 150/106 (BP Location: Left Arm)   Pulse (!) 114   Temp 99.6 ?F (37.6 ?C) (Oral)   Resp 18   Ht 5\' 8"  (1.727  m)   Wt 88.9 kg   SpO2 97%   BMI 29.80 kg/m?  ?Physical Exam ?Vitals and nursing note reviewed.  ?Constitutional:   ?   Appearance: He is well-developed.  ?HENT:  ?   Head: Normocephalic and atraumatic.  ?Cardiovascular:  ?   Rate and Rhythm: Regular rhythm. Tachycardia present.  ?   Heart sounds: No murmur heard. ?Pulmonary:  ?   Effort: Pulmonary effort is normal. No respiratory distress.  ?   Breath sounds: Normal breath sounds.  ?Abdominal:  ?   Palpations: Abdomen is soft.  ?   Tenderness: There is no abdominal tenderness. There is no guarding or rebound.  ?Musculoskeletal:     ?   General: No swelling or tenderness.  ?Skin: ?   General: Skin is warm and dry.  ?Neurological:  ?   Mental Status: He is alert and oriented to person, place, and time.  ?Psychiatric:     ?   Behavior: Behavior normal.  ? ? ?ED Results / Procedures / Treatments   ?Labs ?(all labs ordered are listed, but only abnormal results are displayed) ?Labs Reviewed  ?RESP PANEL BY RT-PCR (FLU A&B, COVID) ARPGX2 - Abnormal; Notable for the following components:  ?    Result Value  ? SARS Coronavirus 2 by RT PCR POSITIVE (*)   ?  All other components within normal limits  ?BASIC METABOLIC PANEL - Abnormal; Notable for the following components:  ? Glucose, Bld 115 (*)   ? All other components within normal limits  ? ? ?EKG ?None ? ?Radiology ?No results found. ? ?Procedures ?Procedures  ? ? ?Medications Ordered in ED ?Medications  ?acetaminophen (TYLENOL) tablet 650 mg (650 mg Oral Given 01/28/22 0139)  ?benzonatate (TESSALON) capsule 200 mg (200 mg Oral Given 01/28/22 0139)  ? ? ?ED Course/ Medical Decision Making/ A&P ?  ?                        ?Medical Decision Making ?Amount and/or Complexity of Data Reviewed ?Labs: ordered. ? ?Risk ?OTC drugs. ?Prescription drug management. ? ? ?Patient with history of hypertension here for evaluation of acute on chronic cough, posttussive emesis.  Patient tachycardic on ED presentation without respiratory  distress, good air movement bilaterally.  No clinical evidence of acute CHF, pneumonia, pneumothorax.  Current clinical picture is not consistent with PE.  He did test positive for COVID-19 in the emergency department.  Given he is early on into his illness he is a candidate for antiviral treatment.  Records reviewed in epic, no recent BMP available for medication dosing.  BMP obtained in the emergency department today with normal renal function.  Discussed with patient risks and benefits of antiviral medication, will prescribe.  Recommend he discontinue his amlodipine while taking the Paxlovid.  Discussed additional home care for COVID-19 infection with return precautions. ? ? ? ? ? ? ? ?Final Clinical Impression(s) / ED Diagnoses ?Final diagnoses:  ?COVID-19 virus infection  ? ? ?Rx / DC Orders ?ED Discharge Orders   ? ?      Ordered  ?  nirmatrelvir/ritonavir EUA (PAXLOVID) 20 x 150 MG & 10 x 100MG  TABS  2 times daily,   Status:  Discontinued       ? 01/28/22 0214  ?  benzonatate (TESSALON) 100 MG capsule  3 times daily PRN       ? 01/28/22 0214  ?  nirmatrelvir/ritonavir EUA (PAXLOVID) 20 x 150 MG & 10 x 100MG  TABS  2 times daily       ? 01/28/22 0215  ? ?  ?  ? ?  ? ? ?  ? , MD ?01/28/22 0244 ? ?

## 2022-04-13 ENCOUNTER — Encounter: Payer: Self-pay | Admitting: Pulmonary Disease

## 2022-04-13 ENCOUNTER — Ambulatory Visit: Payer: 59 | Admitting: Pulmonary Disease

## 2022-04-13 VITALS — BP 128/70 | HR 58 | Temp 98.2°F | Ht 68.0 in | Wt 185.0 lb

## 2022-04-13 DIAGNOSIS — R0600 Dyspnea, unspecified: Secondary | ICD-10-CM | POA: Diagnosis not present

## 2022-04-13 NOTE — Progress Notes (Signed)
Timothy Contreras    [id number]    01/17/1966  Primary Care Physician:Aguiar, Genia Del, MD  Referring Physician: Angelica Chessman, MD [address] [place],  [place] [address]  Chief complaint: Assessment for cough, abnormal CT  HPI: 56 year old with hyperlipidemia, hypertension.  Complains of intermittent dyspnea, cough since April 2020.  Cough is associated with light colored mucus. He had chest x-ray as outpatient that showed right lower lobe scarring and CT coronaries which showed some opacities in the right lung consistent with atelectasis and has been referred here for further evaluation  Pets: Has dogs.  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  No ongoing exposure.  Denies any mold, hot tub, Jacuzzi, feather pillows or comforters or humidifier Smoking history: Never smoker Travel history: Originally from [place].  Moved to [place] in 1995 Relevant family history: No significant family history of lung disease  Interval history: Doing well with regard to his breathing. He has occasional dyspnea on exertion but otherwise stable  Outpatient Encounter Medications as of 04/13/2022  Medication Sig   losartan (COZAAR) 50 MG tablet Take 50 mg by mouth daily.    [DISCONTINUED] amLODipine (NORVASC) 2.5 MG tablet Take 2.5 mg by mouth daily.    [DISCONTINUED] benzonatate (TESSALON) 100 MG capsule Take 2 capsules (200 mg total) by mouth 3 (three) times daily as needed for cough.   [DISCONTINUED] Dexlansoprazole (DEXILANT PO) Take 60 mg by mouth daily.    [DISCONTINUED] famotidine (PEPCID) 40 MG tablet Take 40 mg by mouth at bedtime.    No facility-administered encounter medications on file as of 04/13/2022.   Physical Exam: Blood pressure 128/70, pulse (!) 58, temperature 98.2 F (36.8 C), temperature source  Oral, height 5\' 8"  (1.727 m), weight 185 lb (83.9 kg), SpO2 96 %. Gen:      No acute distress HEENT:  EOMI, sclera anicteric Neck:     No masses; no thyromegaly Lungs:    Clear to auscultation bilaterally; normal respiratory effort CV:         Regular rate and rhythm; no murmurs Abd:      + bowel sounds; soft, non-tender; no palpable masses, no distension Ext:    No edema; adequate peripheral perfusion Skin:      Warm and dry; no rash Neuro: alert and oriented x 3 Psych: normal mood and affect   Data Reviewed: Imaging: CT coronaries 06/27/2019-coronary calcium "score of 0.  No evidence of coronary artery disease Visualized lung fields shows atelectasis in the right lower and right middle lobe and benign appearing hepatic cyst.    CT high-resolution 10/12/2019-no evidence of ILD, elevated right hemidiaphragm with chronic atelectasis, mild air trapping, calcified granuloma in the left upper lobe.  Other pulmonary nodules measuring 95mm or less.  CT chest 04/30/20 - small bilateral lung nodules unchanged since 2020, elevated my diaphragm with lower lobe atelectasis I reviewed the images personally.  Assessment:  Abnormal lung imaging CT chest reviewed with no evidence of interstitial lung disease.  There is no evidence of interstitial lung disease.  However he does have an elevated right hemidiaphragm with associated atelectasis and mild bronchiectasis  Reason for hemidiaphragm elevation is unclear.  He denies any neck or chest surgery trauma.  I assured him that findings are benign.  Continue flutter valve and incentive  spirometry.  Lung nodules Stable on follow-up scan. These are likely benign and do not need follow-up in a non-smoker  Plan/Recommendations: Return to clinic as needed  Chilton Greathouse MD [facility] 04/13/2022, 3:07 PM  CC: Angelica Chessman, MD
